# Patient Record
Sex: Female | Born: 1980 | Race: White | Hispanic: No | Marital: Married | Smoking: Never smoker
Health system: Southern US, Community
[De-identification: ages and names within clinical notes are randomized; demographics above are authoritative.]

## PROBLEM LIST (undated history)

## (undated) DIAGNOSIS — G809 Cerebral palsy, unspecified: Secondary | ICD-10-CM

## (undated) DIAGNOSIS — O1490 Unspecified pre-eclampsia, unspecified trimester: Secondary | ICD-10-CM

## (undated) HISTORY — PX: TUBAL LIGATION: SHX77

---

## 2007-08-06 ENCOUNTER — Encounter: Admission: RE | Admit: 2007-08-06 | Discharge: 2007-11-04 | Payer: Self-pay | Admitting: Counselor

## 2007-11-26 ENCOUNTER — Encounter: Admission: RE | Admit: 2007-11-26 | Discharge: 2008-02-24 | Payer: Self-pay | Admitting: Counselor

## 2009-10-14 ENCOUNTER — Ambulatory Visit (HOSPITAL_BASED_OUTPATIENT_CLINIC_OR_DEPARTMENT_OTHER): Admission: RE | Admit: 2009-10-14 | Discharge: 2009-10-14 | Payer: Self-pay | Admitting: Orthopaedic Surgery

## 2009-10-14 ENCOUNTER — Ambulatory Visit: Payer: Self-pay | Admitting: Diagnostic Radiology

## 2009-11-26 ENCOUNTER — Encounter: Admission: RE | Admit: 2009-11-26 | Discharge: 2009-12-21 | Payer: Self-pay | Admitting: Orthopaedic Surgery

## 2009-12-21 ENCOUNTER — Encounter: Admission: RE | Admit: 2009-12-21 | Discharge: 2009-12-22 | Payer: Self-pay | Admitting: Orthopaedic Surgery

## 2010-01-06 ENCOUNTER — Ambulatory Visit: Payer: Self-pay | Admitting: Diagnostic Radiology

## 2010-01-06 ENCOUNTER — Ambulatory Visit (HOSPITAL_BASED_OUTPATIENT_CLINIC_OR_DEPARTMENT_OTHER): Admission: RE | Admit: 2010-01-06 | Discharge: 2010-01-06 | Payer: Self-pay | Admitting: Orthopaedic Surgery

## 2013-09-07 ENCOUNTER — Emergency Department (HOSPITAL_BASED_OUTPATIENT_CLINIC_OR_DEPARTMENT_OTHER)
Admission: EM | Admit: 2013-09-07 | Discharge: 2013-09-07 | Disposition: A | Discharge status: Home / Self Care | Payer: 59 | Attending: Emergency Medicine | Admitting: Emergency Medicine

## 2013-09-07 ENCOUNTER — Encounter (HOSPITAL_BASED_OUTPATIENT_CLINIC_OR_DEPARTMENT_OTHER): Payer: Self-pay | Admitting: Emergency Medicine

## 2013-09-07 DIAGNOSIS — B9789 Other viral agents as the cause of diseases classified elsewhere: Secondary | ICD-10-CM | POA: Insufficient documentation

## 2013-09-07 DIAGNOSIS — Z79899 Other long term (current) drug therapy: Secondary | ICD-10-CM | POA: Insufficient documentation

## 2013-09-07 DIAGNOSIS — J3489 Other specified disorders of nose and nasal sinuses: Secondary | ICD-10-CM | POA: Insufficient documentation

## 2013-09-07 DIAGNOSIS — R059 Cough, unspecified: Secondary | ICD-10-CM | POA: Insufficient documentation

## 2013-09-07 DIAGNOSIS — IMO0002 Reserved for concepts with insufficient information to code with codable children: Secondary | ICD-10-CM | POA: Insufficient documentation

## 2013-09-07 DIAGNOSIS — R05 Cough: Secondary | ICD-10-CM | POA: Insufficient documentation

## 2013-09-07 DIAGNOSIS — B349 Viral infection, unspecified: Secondary | ICD-10-CM

## 2013-09-07 MED ORDER — GUAIFENESIN ER 600 MG PO TB12: 600.0000 mg | ORAL_TABLET | Freq: Two times a day (BID) | ORAL | Status: AC

## 2013-09-07 MED ORDER — FLUTICASONE PROPIONATE 50 MCG/ACT NA SUSP: 2.0000 | Freq: Every day | NASAL | Status: AC

## 2013-09-07 NOTE — ED Notes (Signed)
Pt reports upper resp. Symptoms x 1 1/2 weeks , no improvements, only medicine taken was robitussin dm which she took about 20 minutes ago

## 2013-09-07 NOTE — ED Provider Notes (Signed)
CSN: [id number]     Arrival date & time 09/07/13  0309 History   First MD Initiated Contact with Patient 09/07/13 [phone]     Chief Complaint  Patient presents with  . Nasal Congestion   (Consider location/radiation/quality/duration/timing/severity/associated sxs/prior Treatment) Patient is a 32 y.o. female presenting with URI. The history is provided by the patient.  URI Presenting symptoms: congestion, cough and rhinorrhea   Presenting symptoms: no fever   Congestion:    Location:  Nasal   Interferes with sleep: no     Interferes with eating/drinking: no   Cough:    Cough characteristics:  Non-productive   Severity:  Mild   Onset quality:  Gradual   Timing:  Intermittent   Progression:  Unchanged   Chronicity:  New Rhinorrhea:    Quality:  Green   Severity:  Mild   Timing:  Intermittent   Progression:  Unchanged Severity:  Moderate Onset quality:  Gradual Timing:  Intermittent Progression:  Unchanged Chronicity:  New Relieved by:  Nothing Worsened by:  Nothing tried Ineffective treatments:  None tried Associated symptoms: no neck pain and no swollen glands   Risk factors: sick contacts   Risk factors: not elderly     History reviewed. No pertinent past medical history. History reviewed. No pertinent past surgical history. History reviewed. No pertinent family history. History  Substance Use Topics  . Smoking status: Never Smoker   . Smokeless tobacco: Not on file  . Alcohol Use: Not on file   OB History   Grav Para Term Preterm Abortions TAB SAB Ect Mult Living                 Review of Systems  Constitutional: Negative for fever.  HENT: Positive for congestion and rhinorrhea.   Respiratory: Positive for cough.   Musculoskeletal: Negative for neck pain.  All other systems reviewed and are negative.    Allergies  Review of patient's allergies indicates no known allergies.  Home Medications   Current Outpatient Rx  Name  Route  Sig  Dispense  Refill   . fluticasone (FLONASE) 50 MCG/ACT nasal spray   Each Nare   Place 2 sprays into both nostrils daily.   16 g   0   . guaiFENesin (MUCINEX) 600 MG 12 hr tablet   Oral   Take 1 tablet (600 mg total) by mouth 2 (two) times daily.   14 tablet   0    BP 127/82  Pulse 102  Temp(Src) 99.2 F (37.3 C) (Oral)  Resp 18  Ht 5\' 6"  (1.676 m)  Wt 167 lb 9.6 oz (76.023 kg)  BMI 27.06 kg/m2  SpO2 97%  LMP 08/24/2013 Physical Exam  Constitutional: She is oriented to person, place, and time. She appears well-developed and well-nourished. No distress.  HENT:  Head: Normocephalic and atraumatic.  Mouth/Throat: Oropharynx is clear and moist.  Clear colorless drainage down the back of the throat  Eyes: Conjunctivae are normal. Pupils are equal, round, and reactive to light.  Neck: Normal range of motion. Neck supple.  Cardiovascular: Normal rate, regular rhythm and intact distal pulses.   Pulmonary/Chest: Effort normal. She has no wheezes. She has no rales.  Abdominal: Soft. Bowel sounds are normal. There is no tenderness. There is no rebound and no guarding.  Musculoskeletal: Normal range of motion.  Lymphadenopathy:    She has no cervical adenopathy.  Neurological: She is alert and oriented to person, place, and time.  Skin: Skin is warm and dry.  Psychiatric: She has a normal mood and affect.    ED Course  Procedures (including critical care time) Labs Review Labs Reviewed - No data to display Imaging Review No results found.  EKG Interpretation   None       MDM   1. Viral syndrome    Will treat with decongestants.  Follow up with your PMD    Sheryll Dymek K Byrd Terrero-Rasch, MD 09/07/13 [phone]

## 2014-12-02 ENCOUNTER — Encounter (HOSPITAL_BASED_OUTPATIENT_CLINIC_OR_DEPARTMENT_OTHER): Payer: Self-pay | Admitting: *Deleted

## 2014-12-02 ENCOUNTER — Emergency Department (HOSPITAL_BASED_OUTPATIENT_CLINIC_OR_DEPARTMENT_OTHER): Payer: Commercial Managed Care - PPO

## 2014-12-02 ENCOUNTER — Emergency Department (HOSPITAL_BASED_OUTPATIENT_CLINIC_OR_DEPARTMENT_OTHER)
Admission: EM | Admit: 2014-12-02 | Discharge: 2014-12-03 | Disposition: A | Discharge status: Home / Self Care | Payer: Commercial Managed Care - PPO | Attending: Emergency Medicine | Admitting: Emergency Medicine

## 2014-12-02 DIAGNOSIS — R2243 Localized swelling, mass and lump, lower limb, bilateral: Secondary | ICD-10-CM | POA: Diagnosis present

## 2014-12-02 DIAGNOSIS — Z79899 Other long term (current) drug therapy: Secondary | ICD-10-CM | POA: Insufficient documentation

## 2014-12-02 DIAGNOSIS — R609 Edema, unspecified: Secondary | ICD-10-CM | POA: Insufficient documentation

## 2014-12-02 DIAGNOSIS — Z3202 Encounter for pregnancy test, result negative: Secondary | ICD-10-CM | POA: Diagnosis not present

## 2014-12-02 DIAGNOSIS — Z7951 Long term (current) use of inhaled steroids: Secondary | ICD-10-CM | POA: Diagnosis not present

## 2014-12-02 DIAGNOSIS — R600 Localized edema: Secondary | ICD-10-CM

## 2014-12-02 DIAGNOSIS — Z8669 Personal history of other diseases of the nervous system and sense organs: Secondary | ICD-10-CM | POA: Insufficient documentation

## 2014-12-02 HISTORY — DX: Cerebral palsy, unspecified: G80.9

## 2014-12-02 HISTORY — DX: Unspecified pre-eclampsia, unspecified trimester: O14.90

## 2014-12-02 LAB — CBC WITH DIFFERENTIAL/PLATELET
BASOS ABS: 0 10*3/uL (ref 0.0–0.1)
BASOS PCT: 0 % (ref 0–1)
EOS ABS: 0.3 10*3/uL (ref 0.0–0.7)
Eosinophils Relative: 4 % (ref 0–5)
HCT: 38.2 % (ref 36.0–46.0)
HEMOGLOBIN: 12.4 g/dL (ref 12.0–15.0)
LYMPHS PCT: 32 % (ref 12–46)
Lymphs Abs: 2.8 10*3/uL (ref 0.7–4.0)
MCH: 27 pg (ref 26.0–34.0)
MCHC: 32.5 g/dL (ref 30.0–36.0)
MCV: 83.2 fL (ref 78.0–100.0)
MONOS PCT: 8 % (ref 3–12)
Monocytes Absolute: 0.7 10*3/uL (ref 0.1–1.0)
NEUTROS PCT: 56 % (ref 43–77)
Neutro Abs: 4.9 10*3/uL (ref 1.7–7.7)
Platelets: 301 10*3/uL (ref 150–400)
RBC: 4.59 MIL/uL (ref 3.87–5.11)
RDW: 13.3 % (ref 11.5–15.5)
WBC: 8.7 10*3/uL (ref 4.0–10.5)

## 2014-12-02 LAB — COMPREHENSIVE METABOLIC PANEL
ALBUMIN: 3.6 g/dL (ref 3.5–5.2)
ALK PHOS: 85 U/L (ref 39–117)
ALT: 25 U/L (ref 0–35)
ANION GAP: 6 (ref 5–15)
AST: 29 U/L (ref 0–37)
BILIRUBIN TOTAL: 0.2 mg/dL — AB (ref 0.3–1.2)
BUN: 15 mg/dL (ref 6–23)
CO2: 26 mmol/L (ref 19–32)
CREATININE: 0.85 mg/dL (ref 0.50–1.10)
Calcium: 8.8 mg/dL (ref 8.4–10.5)
Chloride: 108 mmol/L (ref 96–112)
GFR calc non Af Amer: 88 mL/min — ABNORMAL LOW (ref >90)
GLUCOSE: 105 mg/dL — AB (ref 70–99)
POTASSIUM: 3.6 mmol/L (ref 3.5–5.1)
SODIUM: 140 mmol/L (ref 135–145)
Total Protein: 6.7 g/dL (ref 6.0–8.3)

## 2014-12-02 LAB — URINALYSIS, ROUTINE W REFLEX MICROSCOPIC
Bilirubin Urine: NEGATIVE
GLUCOSE, UA: NEGATIVE mg/dL
Hgb urine dipstick: NEGATIVE
KETONES UR: NEGATIVE mg/dL
LEUKOCYTES UA: NEGATIVE
Nitrite: POSITIVE — AB
PROTEIN: NEGATIVE mg/dL
Specific Gravity, Urine: 1.018 (ref 1.005–1.030)
UROBILINOGEN UA: 0.2 mg/dL (ref 0.0–1.0)
pH: 6 (ref 5.0–8.0)

## 2014-12-02 LAB — URINE MICROSCOPIC-ADD ON

## 2014-12-02 NOTE — ED Provider Notes (Signed)
CSN: [id number]     Arrival date & time 12/02/14  2049 History  This chart was scribed for Joan Mo, MD by Elon Spanner, ED Scribe. This patient was seen in room MH08/MH08 and the patient's care was started at 10:06 PM.   Chief Complaint  Patient presents with  . Leg Swelling   HPI HPI Comments: Joan Sims is a 34 y.o. female who presents to the Emergency Department complaining of bilateral leg swelling onset 3 weeks ago with associated redness on the right foot and halfway up to the knee onset several days ago.  She has had redness in the past as well. Symptoms improve with elevation.  She reports the area is sensitive to touch.  Patient reports she was seen 2 weeks ago for this complaint and received a [place] negative for DVT's.  Patient denies fever, n/v, CP, SOB. She has a history of CP with prior right lower extremity surgery, states that normally her right leg is smaller than the left, however it is prudent to receive more swelling.  Past Medical History  Diagnosis Date  . Preeclampsia   . Cerebral palsy    Past Surgical History  Procedure Laterality Date  . Bladder repair w/ cesarean section    . Tubal ligation     No family history on file. History  Substance Use Topics  . Smoking status: Never Smoker   . Smokeless tobacco: Not on file  . Alcohol Use: Yes   OB History    No data available     Review of Systems  Constitutional: Negative for fever.  Respiratory: Negative for shortness of breath.   Cardiovascular: Negative for chest pain.  Gastrointestinal: Negative for nausea and vomiting.  All other systems reviewed and are negative.     Allergies  Review of patient's allergies indicates no known allergies.  Home Medications   Prior to Admission medications   Medication Sig Start Date End Date Taking? Authorizing Provider  clindamycin (CLEOCIN) 300 MG capsule Take 1 capsule (300 mg total) by mouth 4 (four) times daily. X 7 days 12/03/14   Joan Mo,  MD  fluticasone [facility]) 50 MCG/ACT nasal spray Place 2 sprays into both nostrils daily. 09/07/13   April Palumbo, MD  guaiFENesin (MUCINEX) 600 MG 12 hr tablet Take 1 tablet (600 mg total) by mouth 2 (two) times daily. 09/07/13   April Palumbo, MD   BP 123/83 mmHg  Pulse 80  Temp(Src) 98.3 F (36.8 C) (Oral)  Resp 16  Ht  (1.676 m)  Wt 218 lb (98.884 kg)  BMI 35.20 kg/m2  SpO2 98%  LMP 11/28/2014 Physical Exam  Constitutional: She is oriented to person, place, and time. She appears well-developed and well-nourished.  HENT:  Head: Normocephalic and atraumatic.  Right Ear: External ear normal.  Left Ear: External ear normal.  Eyes: Conjunctivae and EOM are normal. Pupils are equal, round, and reactive to light.  Neck: Normal range of motion. Neck supple.  Cardiovascular: Normal rate, regular rhythm, normal heart sounds and intact distal pulses.   Pulmonary/Chest: Effort normal and breath sounds normal.  Abdominal: Soft. Bowel sounds are normal. There is no tenderness.  Musculoskeletal: Normal range of motion.  Bilateral pitting edema with circumferential mild erythema of the right lower extremity just distal to the knee.  Pulses 1+ palpable DP bilaterally.  Confirmed with doppler by nursing prior to my assessment  Neurological: She is alert and oriented to person, place, and time.  Skin: Skin is warm and  dry.  Vitals reviewed.   ED Course  Procedures (including critical care time)  DIAGNOSTIC STUDIES: Oxygen Saturation is 100% on RA, normal by my interpretation.    COORDINATION OF CARE:  10:15 PM Discussed treatment plan with patient at bedside.  Patient acknowledges and agrees with plan.    Labs Review Labs Reviewed  COMPREHENSIVE METABOLIC PANEL - Abnormal; Notable for the following:    Glucose, Bld 105 (*)    Total Bilirubin 0.2 (*)    GFR calc non Af Amer 88 (*)    All other components within normal limits  URINALYSIS, ROUTINE W REFLEX MICROSCOPIC - Abnormal;  Notable for the following:    Nitrite POSITIVE (*)    All other components within normal limits  URINE MICROSCOPIC-ADD ON - Abnormal; Notable for the following:    Squamous Epithelial / LPF FEW (*)    Bacteria, UA MANY (*)    All other components within normal limits  CBC WITH DIFFERENTIAL/PLATELET  PREGNANCY, URINE    Imaging Review [place] Venous Img Lower Bilateral  12/02/2014   CLINICAL DATA:  Subacute onset of right lower extremity erythema, and chronic bilateral lower extremity swelling. Initial encounter.  EXAM: BILATERAL LOWER EXTREMITY VENOUS DOPPLER ULTRASOUND  TECHNIQUE: Gray-scale sonography with graded compression, as well as color Doppler and duplex ultrasound were performed to evaluate the lower extremity deep venous systems from the level of the common femoral vein and including the common femoral, femoral, profunda femoral, popliteal and calf veins including the posterior tibial, peroneal and gastrocnemius veins when visible. The superficial great saphenous vein was also interrogated. Spectral Doppler was utilized to evaluate flow at rest and with distal augmentation maneuvers in the common femoral, femoral and popliteal veins.  COMPARISON:  None.  FINDINGS: RIGHT LOWER EXTREMITY  Common Femoral Vein: No evidence of thrombus. Normal compressibility, respiratory phasicity and response to augmentation.  Saphenofemoral Junction: No evidence of thrombus. Normal compressibility and flow on color Doppler imaging.  Profunda Femoral Vein: No evidence of thrombus. Normal compressibility and flow on color Doppler imaging.  Femoral Vein: No evidence of thrombus. Normal compressibility, respiratory phasicity and response to augmentation.  Popliteal Vein: No evidence of thrombus. Normal compressibility, respiratory phasicity and response to augmentation.  Calf Veins: No evidence of thrombus. Normal compressibility and flow on color Doppler imaging. The right peroneal vein is not visualized.  Superficial  Great Saphenous Vein: No evidence of thrombus. Normal compressibility and flow on color Doppler imaging.  Venous Reflux:  None.  Other Findings: Mild diffuse soft tissue edema is noted about the ankle.  LEFT LOWER EXTREMITY  Common Femoral Vein: No evidence of thrombus. Normal compressibility, respiratory phasicity and response to augmentation.  Saphenofemoral Junction: No evidence of thrombus. Normal compressibility and flow on color Doppler imaging.  Profunda Femoral Vein: No evidence of thrombus. Normal compressibility and flow on color Doppler imaging.  Femoral Vein: No evidence of thrombus. Normal compressibility, respiratory phasicity and response to augmentation.  Popliteal Vein: No evidence of thrombus. Normal compressibility, respiratory phasicity and response to augmentation.  Calf Veins: No evidence of thrombus. Normal compressibility and flow on color Doppler imaging.  Superficial Great Saphenous Vein: No evidence of thrombus. Normal compressibility and flow on color Doppler imaging.  Venous Reflux:  None.  Other Findings: Mild soft tissue edema is noted about the left ankle.  IMPRESSION: 1. No evidence of deep venous thrombosis. 2. Mild soft tissue edema noted about the ankles, more prominent on the right.   Electronically Signed   By:  Roanna Raider M.D.   On: 12/02/2014 23:45     EKG Interpretation None      MDM   Final diagnoses:  Pedal edema    34 y.o. female with pertinent PMH of recent C section 2 months ago presents with 3 weeks of R le swelling as above.  No systemic symptoms of fever, dyspnea, or other complaint.  Physical exam and vitals on arrival as above.  Wu unremarkable, including bil DVT study.   Concern for possible early cellulitis, so pt placed on clinda.  To fu with PCP.     I have reviewed all laboratory and imaging studies if ordered as above  1. Pedal edema           Joan Mo, MD 12/03/14 [phone]

## 2014-12-02 NOTE — ED Notes (Signed)
Patient transported to Ultrasound 

## 2014-12-02 NOTE — ED Notes (Signed)
Pt c/o swelling in her feet x2 weeks woth right > left. She sts that she was checked for a blood clot in her right foot and the test was negative. Pt gave birth 2 months ago and did have pre-eclampsia.

## 2014-12-02 NOTE — ED Notes (Signed)
MD at bedside. 

## 2014-12-03 ENCOUNTER — Encounter (HOSPITAL_BASED_OUTPATIENT_CLINIC_OR_DEPARTMENT_OTHER): Payer: Self-pay | Admitting: Emergency Medicine

## 2014-12-03 LAB — PREGNANCY, URINE: PREG TEST UR: NEGATIVE

## 2014-12-03 MED ORDER — CLINDAMYCIN HCL 300 MG PO CAPS: 300.0000 mg | ORAL_CAPSULE | Freq: Four times a day (QID) | ORAL | Status: AC

## 2014-12-03 NOTE — Discharge Instructions (Signed)
Peripheral Edema °You have swelling in your legs (peripheral edema). This swelling is due to excess accumulation of salt and water in your body. Edema may be a sign of heart, kidney or liver disease, or a side effect of a medication. It may also be due to problems in the leg veins. Elevating your legs and using special support stockings may be very helpful, if the cause of the swelling is due to poor venous circulation. Avoid long periods of standing, whatever the cause. °Treatment of edema depends on identifying the cause. Chips, pretzels, pickles and other salty foods should be avoided. Restricting salt in your diet is almost always needed. Water pills (diuretics) are often used to remove the excess salt and water from your body via urine. These medicines prevent the kidney from reabsorbing sodium. This increases urine flow. °Diuretic treatment may also result in lowering of potassium levels in your body. Potassium supplements may be needed if you have to use diuretics daily. Daily weights can help you keep track of your progress in clearing your edema. You should call your caregiver for follow up care as recommended. °SEEK IMMEDIATE MEDICAL CARE IF:  °· You have increased swelling, pain, redness, or heat in your legs. °· You develop shortness of breath, especially when lying down. °· You develop chest or abdominal pain, weakness, or fainting. °· You have a fever. °Document Released: 10/06/2004 Document Revised: 11/21/2011 Document Reviewed: 09/16/2009 °[employer]® Patient Information ©2015 [employer]. This information is not intended to replace advice given to you by your health care provider. Make sure you discuss any questions you have with your health care provider. °Cellulitis °Cellulitis is an infection of the skin and the tissue beneath it. The infected area is usually red and tender. Cellulitis occurs most often in the arms and lower legs.  °CAUSES  °Cellulitis is caused by bacteria that enter the skin  through cracks or cuts in the skin. The most common types of bacteria that cause cellulitis are staphylococci and streptococci. °SIGNS AND SYMPTOMS  °· Redness and warmth. °· Swelling. °· Tenderness or pain. °· Fever. °DIAGNOSIS  °Your health care provider can usually determine what is wrong based on a physical exam. Blood tests may also be done. °TREATMENT  °Treatment usually involves taking an antibiotic medicine. °HOME CARE INSTRUCTIONS  °· Take your antibiotic medicine as directed by your health care provider. Finish the antibiotic even if you start to feel better. °· Keep the infected arm or leg elevated to reduce swelling. °· Apply a warm cloth to the affected area up to 4 times per day to relieve pain. °· Take medicines only as directed by your health care provider. °· Keep all follow-up visits as directed by your health care provider. °SEEK MEDICAL CARE IF:  °· You notice red streaks coming from the infected area. °· Your red area gets larger or turns dark in color. °· Your bone or joint underneath the infected area becomes painful after the skin has healed. °· Your infection returns in the same area or another area. °· You notice a swollen bump in the infected area. °· You develop new symptoms. °· You have a fever. °SEEK IMMEDIATE MEDICAL CARE IF:  °· You feel very sleepy. °· You develop vomiting or diarrhea. °· You have a general ill feeling (malaise) with muscle aches and pains. °MAKE SURE YOU:  °· Understand these instructions. °· Will watch your condition. °· Will get help right away if you are not doing well or get worse. °  Document Released: 06/08/2005 Document Revised: 01/13/2014 Document Reviewed: 11/14/2011 °ExitCare® Patient Information ©2015 [employer]. This information is not intended to replace advice given to you by your health care provider. Make sure you discuss any questions you have with your health care provider. ° °

## 2015-02-19 ENCOUNTER — Emergency Department (HOSPITAL_BASED_OUTPATIENT_CLINIC_OR_DEPARTMENT_OTHER)
Admission: EM | Admit: 2015-02-19 | Discharge: 2015-02-19 | Disposition: A | Discharge status: Home / Self Care | Payer: Commercial Managed Care - PPO | Attending: Emergency Medicine | Admitting: Emergency Medicine

## 2015-02-19 ENCOUNTER — Encounter (HOSPITAL_BASED_OUTPATIENT_CLINIC_OR_DEPARTMENT_OTHER): Payer: Self-pay | Admitting: *Deleted

## 2015-02-19 DIAGNOSIS — Z7951 Long term (current) use of inhaled steroids: Secondary | ICD-10-CM | POA: Insufficient documentation

## 2015-02-19 DIAGNOSIS — L03115 Cellulitis of right lower limb: Secondary | ICD-10-CM | POA: Insufficient documentation

## 2015-02-19 DIAGNOSIS — Z8669 Personal history of other diseases of the nervous system and sense organs: Secondary | ICD-10-CM | POA: Insufficient documentation

## 2015-02-19 DIAGNOSIS — Z792 Long term (current) use of antibiotics: Secondary | ICD-10-CM | POA: Insufficient documentation

## 2015-02-19 DIAGNOSIS — Z79899 Other long term (current) drug therapy: Secondary | ICD-10-CM | POA: Insufficient documentation

## 2015-02-19 DIAGNOSIS — M7989 Other specified soft tissue disorders: Secondary | ICD-10-CM | POA: Diagnosis present

## 2015-02-19 MED ORDER — DOXYCYCLINE HYCLATE 100 MG PO CAPS: 100.0000 mg | ORAL_CAPSULE | Freq: Two times a day (BID) | ORAL | Status: AC

## 2015-02-19 MED ORDER — DOXYCYCLINE HYCLATE 100 MG PO CAPS: 100.0000 mg | ORAL_CAPSULE | Freq: Two times a day (BID) | ORAL | Status: DC

## 2015-02-19 NOTE — ED Provider Notes (Signed)
CSN: [id number]     Arrival date & time 02/19/15  0732 History   First MD Initiated Contact with Patient 02/19/15 [phone]     Chief Complaint  Patient presents with  . Leg Swelling     (Consider location/radiation/quality/duration/timing/severity/associated sxs/prior Treatment) The history is provided by the patient.   34 year old female with the recurrent right leg swelling and redness and some blistering to the anterior part of the right shin. Associated with itching and burning. Similar problem back in March treated as a cellulitis after extensive workup for the leg swelling. Patient was treated clindamycin. Patient states that had no effect whatsoever. At that time patient did not have any of the blistering or vesicles. Patient denies any exposure to poison ivy or any possibility of a contact dermatitis. No problem with the left leg. Patient had long-term swelling of the right leg. Worse since the delivery of this year. Patient has a history of cerebral palsy and had operations on the right leg in the past.  Past Medical History  Diagnosis Date  . Preeclampsia   . Cerebral palsy    Past Surgical History  Procedure Laterality Date  . Bladder repair w/ cesarean section    . Tubal ligation     History reviewed. No pertinent family history. History  Substance Use Topics  . Smoking status: Never Smoker   . Smokeless tobacco: Not on file  . Alcohol Use: Yes   OB History    No data available     Review of Systems  Constitutional: Negative for fever.  HENT: Negative for congestion.   Eyes: Negative for redness.  Respiratory: Negative for shortness of breath.   Cardiovascular: Negative for chest pain.  Gastrointestinal: Negative for abdominal pain.  Genitourinary: Negative for dysuria.  Musculoskeletal: Negative for myalgias.  Skin: Positive for rash.  Neurological: Negative for headaches.  Hematological: Does not bruise/bleed easily.  Psychiatric/Behavioral: Negative for  confusion.      Allergies  Review of patient's allergies indicates no known allergies.  Home Medications   Prior to Admission medications   Medication Sig Start Date End Date Taking? Authorizing Provider  clindamycin (CLEOCIN) 300 MG capsule Take 1 capsule (300 mg total) by mouth 4 (four) times daily. X 7 days 12/03/14   Mirian Mo, MD  doxycycline (VIBRAMYCIN) 100 MG capsule Take 1 capsule (100 mg total) by mouth 2 (two) times daily. 02/19/15   Vanetta Mulders, MD  fluticasone (FLONASE) 50 MCG/ACT nasal spray Place 2 sprays into both nostrils daily. 09/07/13   April Palumbo, MD  guaiFENesin (MUCINEX) 600 MG 12 hr tablet Take 1 tablet (600 mg total) by mouth 2 (two) times daily. 09/07/13   April Palumbo, MD   BP 131/87 mmHg  Pulse 84  Temp(Src) 97.7 F (36.5 C) (Oral)  Resp 18  Ht  (1.676 m)  Wt 231 lb 14.4 oz (105.189 kg)  BMI 37.45 kg/m2  SpO2 100% Physical Exam  Constitutional: She is oriented to person, place, and time. She appears well-developed and well-nourished. No distress.  HENT:  Head: Normocephalic and atraumatic.  Mouth/Throat: Oropharynx is clear and moist.  Eyes: Conjunctivae and EOM are normal. Pupils are equal, round, and reactive to light.  Neck: Normal range of motion.  Cardiovascular: Normal rate, regular rhythm and normal heart sounds.   No murmur heard. Pulmonary/Chest: Effort normal and breath sounds normal. No respiratory distress.  Abdominal: Soft. Bowel sounds are normal. There is no tenderness.  Musculoskeletal: She exhibits edema.  Normal except for  right anterior shin with large area of erythema with some vesicles. Area of redness of and increased warmth is approximately 15 x 7 cm. Some increased swelling to the anterior part of the foot with some slight redness there. No streaking. Refill is 2 seconds sensations intact. All the vesicles are intact.  Neurological: She is alert and oriented to person, place, and time. No cranial nerve deficit.  She exhibits normal muscle tone. Coordination normal.  Skin: Skin is warm. Rash noted. There is erythema.  Nursing note and vitals reviewed.   ED Course  Procedures (including critical care time) Labs Review Labs Reviewed - No data to display  Imaging Review No results found.   EKG Interpretation None      MDM   Final diagnoses:  Cellulitis of right lower extremity    Patient with recurrent cellulitis to the right anterior shin. Had similar problem in March. Patient stated that was treated with clindamycin without any effect on it whatsoever. Clinically here there is some mild vesicles associated with it denies any poison ivy exposure. But becoming suspicious that this recurrent redness and area could be dermatological and not cellulitis. However will treat as cellulitis with doxycycline. Patient will start using hydrocortisone cream if no improvement with the anabiotic like before after a few days.  In March patient had extensive workup which included bilateral Dopplers with everything being negative. Patient has a history of cerebral palsy. Since her delivery earlier this year patient's had some increase problems with swelling to the right leg.    Vanetta Mulders, MD 02/19/15 [phone]

## 2015-02-19 NOTE — ED Notes (Signed)
RLE swelling with redness and with multiple raised areas, c/o severe itching as well

## 2015-02-19 NOTE — ED Notes (Signed)
Area marked with marker

## 2015-02-19 NOTE — Discharge Instructions (Signed)
As we discussed take the anabolic as directed. If you don't see improvement in a few days may want to consider treating the area with topical hydrocortisone cream. Also would recommend follow-up with primary care doctor and/or dermatology. Resource guide provided below.    Emergency Department Resource Guide 1) Find a Doctor and Pay Out of Pocket Although you won't have to find out who is covered by your insurance plan, it is a good idea to ask around and get recommendations. You will then need to call the office and see if the doctor you have chosen will accept you as a new patient and what types of options they offer for patients who are self-pay. Some doctors offer discounts or will set up payment plans for their patients who do not have insurance, but you will need to ask so you aren't surprised when you get to your appointment.  2) Contact Your Local Health Department Not all health departments have doctors that can see patients for sick visits, but many do, so it is worth a call to see if yours does. If you don't know where your local health department is, you can check in your phone book. The CDC also has a tool to help you locate your state's health department, and many state websites also have listings of all of their local health departments.  3) Find a Walk-in Clinic If your illness is not likely to be very severe or complicated, you may want to try a walk in clinic. These are popping up all over the country in pharmacies, drugstores, and shopping centers. They're usually staffed by nurse practitioners or physician assistants that have been trained to treat common illnesses and complaints. They're usually fairly quick and inexpensive. However, if you have serious medical issues or chronic medical problems, these are probably not your best option.  No Primary Care Doctor: - Call Health Connect at  [phone] - they can help you locate a primary care doctor that  accepts your insurance,  provides certain services, etc. - Physician Referral Service- [phone]  Chronic Pain Problems: Organization         Address  Phone   Notes  [facility]  [phone] Patients need to be referred by their primary care doctor.   Medication Assistance: Organization         Address  Phone   Notes  [facility] Medication [facility] [address] [place] [place], [place] [address] [phone] --Must be a resident of [facility] -- Must have NO insurance coverage whatsoever (no Medicaid/ Medicare, etc.) -- The pt. MUST have a primary care doctor that directs their care regularly and follows them in the community   [facility]  [phone]   [employer]  [phone]    Agencies that provide inexpensive medical care: Organization         Address  Phone   Notes  [facility]  [phone]   [facility]    [phone]   [facility] [address] [place], [place] [address] [phone]   [facility] [address] [place] [place] [phone]   Planned Parenthood    [phone]   [facility]    [phone]   [facility]  [address], [place] Phone:  [phone], Fax:  [phone] Hours of Operation:  9 am - 6 pm, M-F.  Also  accepts Medicaid/Medicare and self-pay.  [facility] [place] [address], [place] Phone: [phone], Fax: [phone]. Hours of Operation:  8:30 am - 5:30 pm, M-F.  Also accepts Medicaid and self-pay.  [facility] [place] [address], [place] Phone: [phone]   [place], [place], [place] [phone], Ext. [phone] Mondays & Thursdays: 7-9 AM.  First 15 patients are seen on a first come, first serve basis.    [place] Providers:  Organization         Address  Phone    Notes  [facility] [address], [place] [phone] Also accepts self-pay patients.  [facility] [id number] [place], [place]  [phone]   [place], [address], [place] [phone]   [facility] [address], [place] [phone]   Lucianne Lei [address], [place]   [phone] Only accepts [place] Access [place] patients after they have their name applied to their card.   Self-Pay (no insurance) in [facility]:  Organization         Address  Phone   Notes  Sickle Cell Patients, [facility] Internal Medicine [place] [phone]   [facility] [place] [phone]   [facility] [place]  [place], [place], [place] [phone]   [facility]/Dr. Osei-Bonsu  [address], [place] or [address], [place] [phone] Phone number for both [place] and [place] locations is the same.  [facility] [address] [place] [phone]   [facility] [address], [place] or [address] [phone]   [facility] [address] [place] [phone], phone; [phone], fax Sees patients 1st and 3rd Saturday of every month.  Must not qualify for public or private insurance (i.e. Medicaid, Medicare, [place] Health Choice, Veterans' Benefits)  Household income should be no more than 200% of the poverty level The clinic cannot treat you if you are pregnant or think you are pregnant  Sexually transmitted diseases are not treated at the clinic.    Dental Care: Organization         Address  Phone  Notes  [facility] Department of [facility] [place] [phone] Accepts children up to age 65 who are enrolled in [place] or [place]; pregnant women with a Medicaid card; and children who have applied for Medicaid or [place] Health Choice, but were declined, whose parents can pay a reduced fee at time of service.  [facility] Department of [facility]  [address], [place] [phone] Accepts children up to age 36 who are enrolled in [place] or [place]; pregnant women with a Medicaid card; and children who have applied for Medicaid or [place] Health Choice, but were declined, whose parents can pay a reduced fee at time of service.  [facility]  [place] [phone] Patients are seen by appointment only. Walk-ins are not accepted. [place] will see patients 61 years of age and older. Monday - Tuesday (8am-5pm)  Most Wednesdays (8:30-5pm) $30 per visit, cash only  [facility] Adult [employer] PROGRAM  [address], [facility] [phone] Patients are seen by appointment only. Walk-ins are not accepted. [place] will see patients 29 years of age and older. One Wednesday Evening (Monthly: Volunteer Based).  $30 per visit, cash only  [place]  [phone] for adults; Children under age 60, call Graduate Pediatric Dentistry at [phone]. Children aged 57-14, please call [phone] to request a pediatric application.  Dental services are provided in all areas of dental care including fillings, crowns and bridges, complete and partial dentures, implants, gum treatment, root canals, and extractions. Preventive care is also provided. Treatment is provided to both adults and children. Patients are selected via a lottery and there is often a waiting list.   [facility] [address], [place]  [phone] [email or web address]   [facility] [address] [place], [place] [phone], Ext.  123 Second and Fourth Thursday of each month, opens at 6:30 AM; Clinic ends at 9 AM.  Patients are seen on a first-come first-served basis, and a limited number are seen during each clinic.   [facility]  [address] Hillard Danker [place], [place] [phone]   Eligibility Requirements You must have lived in [place], [place], or [place] counties for at least the last three months.   You cannot be eligible for state or federal sponsored [employer], including [employer], [place], or [employer].   You generally cannot be eligible for healthcare insurance through your employer.    How to apply: Eligibility screenings are held every Tuesday and Wednesday afternoon from 1:00 pm until 4:00 pm. You do not need an appointment for the interview!  [facility] [address] [place], [place]   [place]  [place] Department  [place]  [phone]    Behavioral Health Resources in the Community: Intensive Outpatient Programs Organization         Address  Phone  Notes  [facility] [place]. [address], [place], [place] [phone]   [facility]patient [address] [place], [place]   ADS: [facility] [address], [place], [place]   [place] [address]  [place], [place] or [phone]   Substance Abuse Resources Organization         Address  Phone  Notes  Alcohol and Drug Services  [phone]   [place]  ([phone]   [facility] [place]   Chinita Pester  ([phone]   Residential & Outpatient Substance Abuse Program  [phone]   Psychological Services Organization         Address  Phone  Notes  [facility] [place]  [place]  [phone]   [place] [address] [place] or [phone]    Mobile Crisis Teams Organization         Address  Phone  Notes  [facility], Mobile Crisis Care Unit  [phone]   [facility]otherapeutic [facility]  [address] [place], [place]   [place] [address] [place] [phone]    Self-Help/Support Groups Organization         Address  Phone  Notes  [facility] [place] - variety of support groups  [place] Call for more information  Narcotics Anonymous (NA), [facility] [address], [employer] [place]  2 meetings at this location   Special educational needs teacher         Address  Phone  Notes  ASAP Residential Treatment [place],    [place]  [phone]   [facility]  [address], [place] [address], [place], [place]   [place], [place] [phone] Admissions: 8am-3pm M-F  Incentives Substance [place] [address]    [place], [place] [phone]   [facility] [address] [place], [place], [place]   [facility] [address],  [place], [place]   [facility] Programs - Intensive Outpatient [address] Kristeen [address], [place], [place]   [facility] ([place].,  [place], [place] [phone] or [phone]   [facility] (RTS) [address], [place], [place] Accepts Medicaid  [facility] [place] [address],  [place] [phone] Substance Abuse/Addiction Treatment   [facility] Organization         Address  Phone  Notes  [facility]  [phone]   Domenic Schwab, PhD [address] Arlis Porta [place], [place]   [phone] or [phone]   [place]  [place], [place] [phone]   [facility] [address], [place], [place] [phone] Insurance/Medicaid/sponsorship through [facility] and Families [address], [address] [place]                                    [place], [place] [phone] [place] [address] [place], [place] [phone]    Dr. Adele Schilder  [phone]   [facility] [place] Dept. 1) [address], [place] 2) [place] 3)  [place] [address], [place] [phone]  [phone]   [place] [phone] or [phone] (After Hours)

## 2015-02-19 NOTE — ED Notes (Signed)
MD at bedside. 

## 2018-03-28 ENCOUNTER — Encounter (HOSPITAL_BASED_OUTPATIENT_CLINIC_OR_DEPARTMENT_OTHER): Payer: Self-pay | Admitting: Radiology

## 2018-03-28 ENCOUNTER — Other Ambulatory Visit: Payer: Self-pay

## 2018-03-28 ENCOUNTER — Emergency Department (HOSPITAL_COMMUNITY): Payer: Commercial Managed Care - PPO

## 2018-03-28 ENCOUNTER — Emergency Department (HOSPITAL_BASED_OUTPATIENT_CLINIC_OR_DEPARTMENT_OTHER)
Admission: EM | Admit: 2018-03-28 | Discharge: 2018-03-29 | Disposition: A | Discharge status: Other Acute Inpatient Hospital | Payer: Commercial Managed Care - PPO | Attending: Emergency Medicine | Admitting: Emergency Medicine

## 2018-03-28 ENCOUNTER — Emergency Department (HOSPITAL_BASED_OUTPATIENT_CLINIC_OR_DEPARTMENT_OTHER): Payer: Commercial Managed Care - PPO

## 2018-03-28 DIAGNOSIS — N3000 Acute cystitis without hematuria: Secondary | ICD-10-CM | POA: Insufficient documentation

## 2018-03-28 DIAGNOSIS — R42 Dizziness and giddiness: Secondary | ICD-10-CM | POA: Insufficient documentation

## 2018-03-28 DIAGNOSIS — G809 Cerebral palsy, unspecified: Secondary | ICD-10-CM | POA: Insufficient documentation

## 2018-03-28 DIAGNOSIS — Z79899 Other long term (current) drug therapy: Secondary | ICD-10-CM | POA: Insufficient documentation

## 2018-03-28 LAB — URINALYSIS, ROUTINE W REFLEX MICROSCOPIC
BILIRUBIN URINE: NEGATIVE
Glucose, UA: NEGATIVE mg/dL
HGB URINE DIPSTICK: NEGATIVE
Ketones, ur: NEGATIVE mg/dL
Leukocytes, UA: NEGATIVE
Nitrite: POSITIVE — AB
PH: 7.5 (ref 5.0–8.0)
Protein, ur: NEGATIVE mg/dL
SPECIFIC GRAVITY, URINE: 1.01 (ref 1.005–1.030)

## 2018-03-28 LAB — RAPID URINE DRUG SCREEN, HOSP PERFORMED
AMPHETAMINES: NOT DETECTED
BENZODIAZEPINES: NOT DETECTED
Cocaine: NOT DETECTED
OPIATES: NOT DETECTED
Tetrahydrocannabinol: NOT DETECTED

## 2018-03-28 LAB — CBC
HCT: 41.2 % (ref 36.0–46.0)
HEMOGLOBIN: 13.6 g/dL (ref 12.0–15.0)
MCH: 26 pg (ref 26.0–34.0)
MCHC: 33 g/dL (ref 30.0–36.0)
MCV: 78.8 fL (ref 78.0–100.0)
Platelets: 254 10*3/uL (ref 150–400)
RBC: 5.23 MIL/uL — ABNORMAL HIGH (ref 3.87–5.11)
RDW: 14.6 % (ref 11.5–15.5)
WBC: 8.6 10*3/uL (ref 4.0–10.5)

## 2018-03-28 LAB — BASIC METABOLIC PANEL
Anion gap: 7 (ref 5–15)
BUN: 12 mg/dL (ref 6–20)
CO2: 25 mmol/L (ref 22–32)
Calcium: 9 mg/dL (ref 8.9–10.3)
Chloride: 108 mmol/L (ref 98–111)
Creatinine, Ser: 0.53 mg/dL (ref 0.44–1.00)
GFR calc Af Amer: >60 mL/min (ref >60)
GFR calc non Af Amer: >60 mL/min (ref >60)
GLUCOSE: 108 mg/dL — AB (ref 70–99)
POTASSIUM: 3.9 mmol/L (ref 3.5–5.1)
Sodium: 140 mmol/L (ref 135–145)

## 2018-03-28 LAB — URINALYSIS, MICROSCOPIC (REFLEX)

## 2018-03-28 LAB — CBG MONITORING, ED: Glucose-Capillary: 104 mg/dL — ABNORMAL HIGH (ref 70–99)

## 2018-03-28 LAB — ETHANOL

## 2018-03-28 LAB — PREGNANCY, URINE: Preg Test, Ur: NEGATIVE

## 2018-03-28 MED ORDER — IOPAMIDOL (ISOVUE-370) INJECTION 76%
100.0000 mL | Freq: Once | INTRAVENOUS | Status: AC | PRN
  Administered 2018-03-28: 100 mL via INTRAVENOUS

## 2018-03-28 MED ORDER — LORAZEPAM 2 MG/ML IJ SOLN
0.5000 mg | Freq: Once | INTRAMUSCULAR | Status: AC
  Administered 2018-03-28: 0.5 mg via INTRAVENOUS
  Filled 2018-03-28: qty 1

## 2018-03-28 MED ORDER — MECLIZINE HCL 25 MG PO TABS
50.0000 mg | ORAL_TABLET | Freq: Once | ORAL | Status: AC
  Administered 2018-03-28: 50 mg via ORAL
  Filled 2018-03-28: qty 2

## 2018-03-28 MED ORDER — LORAZEPAM 2 MG/ML IJ SOLN
1.0000 mg | Freq: Once | INTRAMUSCULAR | Status: AC
  Administered 2018-03-28: 1 mg via INTRAVENOUS
  Filled 2018-03-28: qty 1

## 2018-03-28 MED ORDER — FOSFOMYCIN TROMETHAMINE 3 G PO PACK
3.0000 g | PACK | Freq: Once | ORAL | Status: AC
  Administered 2018-03-28: 3 g via ORAL
  Filled 2018-03-28: qty 3

## 2018-03-28 MED ORDER — MECLIZINE HCL 25 MG PO TABS
25.0000 mg | ORAL_TABLET | Freq: Once | ORAL | Status: AC
  Administered 2018-03-28: 25 mg via ORAL
  Filled 2018-03-28: qty 1

## 2018-03-28 MED ORDER — DIAZEPAM 2 MG PO TABS
2.0000 mg | ORAL_TABLET | Freq: Once | ORAL | Status: AC
  Administered 2018-03-28: 2 mg via ORAL
  Filled 2018-03-28: qty 1

## 2018-03-28 MED ORDER — SODIUM CHLORIDE 0.9 % IV BOLUS
1000.0000 mL | Freq: Once | INTRAVENOUS | Status: AC
  Administered 2018-03-28: 1000 mL via INTRAVENOUS

## 2018-03-28 MED ORDER — DEXAMETHASONE SODIUM PHOSPHATE 10 MG/ML IJ SOLN
10.0000 mg | Freq: Once | INTRAMUSCULAR | Status: AC
  Administered 2018-03-28: 10 mg via INTRAVENOUS
  Filled 2018-03-28: qty 1

## 2018-03-28 NOTE — ED Triage Notes (Signed)
Pt arrived by  [facility]  Transferred from [facility] to get a mri  Dizziness with n and v was seen there x2 for the same  Pt moved to waiting room no rooms in back and no  Rooms available in triage.  Alert no distress

## 2018-03-28 NOTE — ED Provider Notes (Signed)
Assumed care of the patient.  [facility].  Briefly, the patient is a very well-appearing 37 year old female with history of cerebral palsy here with what I suspect is a peripheral vertigo.  She has unidirectional, horizontal nystagmus that is made worse with head position changes.  However, she had persistent, severe symptoms so was sent here for MRI.  Of note, the patient works in a daycare and I suspect she may have a viral labyrinthitis.  History is not entirely consistent with BPPV and she had no improvement with Dix-Hallpike.  Will give higher doses of meclizine and Ativan, sent for MRI.  Suspect she can be managed as an outpatient if her symptoms improve and the MRI is negative.   Shaune PollackIsaacs, Belinda Schlichting, MD 03/28/18 2213

## 2018-03-28 NOTE — ED Notes (Signed)
Patient transported to CT 

## 2018-03-28 NOTE — ED Triage Notes (Addendum)
Per EMS patient from home.  C/o dizziness and nausea since yesterday afternoon which has since become worse.  Reports tried taking meclizine and zofran but then vomited it back up last night.  Reports that this morning she was unable to stay balanced when walking.  Given 8mg  zofran IV via EMS

## 2018-03-28 NOTE — ED Provider Notes (Signed)
[facility] EMERGENCY DEPARTMENT Provider Note   CSN: [id number] Arrival date & time: 03/28/18  4098     History   Chief Complaint Chief Complaint  Patient presents with  . Dizziness    HPI Joan Sims is a 37 y.o. female who presents emergency department chief complaint of dizziness.  Patient states that the first onset of her symptoms started around noon yesterday.  She was sitting in her car when she had onset of spinning sensation that lasted only a few seconds and then self resolved.  Around 4:30 PM symptoms returned however they were mild.  She was able to pick her children up and go to [place].  She said at times she would have to stop and write herself because she would feel as if she was spinning and going to fall however she kept her eyes focused she was okay.  Later that evening her symptoms progressively worsened to the point where anytime she opens her eyes or moves she feels like she is on a "bad roller coaster."  She has some relief keeping her eyes closed but has had to pull episodes of vomiting and dry heaving.  She is never had anything like this before.  She denies any recent neck injuries, motor vehicle collisions, chiropractic adjustments, ear pain, recent viral infections, barotrauma.  She was given Zofran upon arrival and her symptoms have improved.  She denies any other neurologic symptoms. HPI  Past Medical History:  Diagnosis Date  . Cerebral palsy (HCC)   . Preeclampsia     There are no active problems to display for this patient.   Past Surgical History:  Procedure Laterality Date  . BLADDER REPAIR W/ CESAREAN SECTION    . TUBAL LIGATION       OB History   None      Home Medications    Prior to Admission medications   Medication Sig Start Date End Date Taking? Authorizing Provider  clindamycin (CLEOCIN) 300 MG capsule Take 1 capsule (300 mg total) by mouth 4 (four) times daily. X 7 days Patient not taking: Reported on 03/28/2018  12/03/14   Mirian Mo, MD  doxycycline (VIBRAMYCIN) 100 MG capsule Take 1 capsule (100 mg total) by mouth 2 (two) times daily. Patient not taking: Reported on 03/28/2018 02/19/15   Vanetta Mulders, MD  fluticasone [facility]) 50 MCG/ACT nasal spray Place 2 sprays into both nostrils daily. Patient not taking: Reported on 03/28/2018 09/07/13   Palumbo, April, MD  guaiFENesin (MUCINEX) 600 MG 12 hr tablet Take 1 tablet (600 mg total) by mouth 2 (two) times daily. Patient not taking: Reported on 03/28/2018 09/07/13   Cy Blamer, MD    Family History No family history on file.  Social History Social History   Tobacco Use  . Smoking status: Never Smoker  Substance Use Topics  . Alcohol use: Yes  . Drug use: Yes    Types: Marijuana     Allergies   Patient has no known allergies.   Review of Systems Review of Systems  Ten systems reviewed and are negative for acute change, except as noted in the HPI.   Physical Exam Updated Vital Signs BP 132/75   Pulse 95   Temp 98.5 F (36.9 C) (Oral)   Resp 13   Ht 5\' 6"  (1.676 m)   Wt 106.6 kg (235 lb)   LMP 02/28/2018   SpO2 100%   BMI 37.93 kg/m   Physical Exam  Constitutional: She is oriented to person,  place, and time. She appears well-developed and well-nourished. No distress.  Patient sitting with eyes closed.  Nystagmus is visible through closed lids  HENT:  Head: Normocephalic and atraumatic.  Eyes: Conjunctivae and EOM are normal. No scleral icterus.  Neck: Normal range of motion.  Cardiovascular: Normal rate, regular rhythm and normal heart sounds. Exam reveals no gallop and no friction rub.  No murmur heard. Pulmonary/Chest: Effort normal and breath sounds normal. No respiratory distress.  Abdominal: Soft. Bowel sounds are normal. She exhibits no distension and no mass. There is no tenderness. There is no guarding.  Neurological: She is alert and oriented to person, place, and time. No cranial nerve deficit.  Coordination normal.  Neurologic examination is limited secondary to patient's vertigo and nystagmus.  Patient has left beat only constant nystagmus.  No rotary component. Negative test of skew.  Otherwise normal upper and lower extremity strength, reflexes, sensation.   Gait and balance deffered  Skin: Skin is warm and dry. She is not diaphoretic.  Psychiatric: Her behavior is normal.  Nursing note and vitals reviewed.    ED Treatments / Results  Labs (all labs ordered are listed, but only abnormal results are displayed) Labs Reviewed  RAPID URINE DRUG SCREEN, HOSP PERFORMED - Abnormal; Notable for the following components:      Result Value   Barbiturates   (*)    Value: Result not available. Reagent lot number recalled by manufacturer.   All other components within normal limits  URINALYSIS, ROUTINE W REFLEX MICROSCOPIC - Abnormal; Notable for the following components:   APPearance CLOUDY (*)    Nitrite POSITIVE (*)    All other components within normal limits  BASIC METABOLIC PANEL - Abnormal; Notable for the following components:   Glucose, Bld 108 (*)    All other components within normal limits  CBC - Abnormal; Notable for the following components:   RBC 5.23 (*)    All other components within normal limits  URINALYSIS, MICROSCOPIC (REFLEX) - Abnormal; Notable for the following components:   Bacteria, UA MANY (*)    All other components within normal limits  CBG MONITORING, ED - Abnormal; Notable for the following components:   Glucose-Capillary 104 (*)    All other components within normal limits  URINE CULTURE  ETHANOL  PREGNANCY, URINE    EKG EKG Interpretation  Date/Time:  Wednesday March 28 2018 10:14:13 EDT Ventricular Rate:  68 PR Interval:    QRS Duration: 99 QT Interval:  416 QTC Calculation: 443 R Axis:   44 Text Interpretation:  Sinus rhythm Low voltage, extremity leads No old tracing to compare Confirmed by Azalia Bilis ([id number]) on 03/28/2018  11:47:07 AM   Radiology Ct Angio Head W Or Wo Contrast  Result Date: 03/28/2018 CLINICAL DATA:  Initial evaluation for acute dizziness, nausea. EXAM: CT ANGIOGRAPHY HEAD AND NECK TECHNIQUE: Multidetector CT imaging of the head and neck was performed using the standard protocol during bolus administration of intravenous contrast. Multiplanar CT image reconstructions and MIPs were obtained to evaluate the vascular anatomy. Carotid stenosis measurements (when applicable) are obtained utilizing NASCET criteria, using the distal internal carotid diameter as the denominator. CONTRAST:  ISOVUE-370 IOPAMIDOL (ISOVUE-370) INJECTION 76% COMPARISON:  None available. FINDINGS: CT HEAD FINDINGS Brain: Cerebral volume within normal limits for age. There is abnormal encephalomalacia involving the posterior periventricular white matter adjacent to the left lateral ventricle at the posterior left frontal region (series 5, image 20). Abnormal ex vacuo dilatation with slight angulation of the  underlying ventricular margin. Possible communicating cleft seen to the overlying brain surface within this region (series 8, image 40). White matter is thinned and atrophic in this area. Finding is chronic/congenital in appearance. No acute intracranial hemorrhage. No acute large vessel territory infarct. No mass lesion, midline shift or mass effect. Ex vacuo dilatation of the left lateral ventricle without hydrocephalus. Mild colpocephaly. Midline structures grossly intact. No extra-axial fluid collection. Vascular: No hyperdense vessel. Skull: Scalp soft tissues demonstrate no acute abnormality. Multiple oval circumscribed calcified nodules noted within the subcutaneous fat of the scalp, likely Trichilemmal cysts. Calvarium intact. Sinuses: Paranasal sinuses and mastoid air cells are clear. Orbits: Globes and orbital soft tissues within normal limits. CTA NECK FINDINGS Aortic arch: Visualized aortic arch of normal caliber with  normal branch pattern. No flow-limiting stenosis about the origin of the great vessels. Visualized subclavian arteries widely patent. Right carotid system: Right common and internal carotid arteries widely patent without stenosis, dissection, or occlusion. Left carotid system: Left common and internal carotid arteries widely patent without stenosis, dissection, or occlusion. Vertebral arteries: Both of the vertebral arteries arise from the subclavian arteries. Vertebral arteries widely patent within the neck without stenosis, dissection, or occlusion. Skeleton: No acute osseous abnormality. No worrisome lytic or blastic osseous lesions. Mild cervical spondylolysis noted at C5-6. Other neck: Unremarkable. Upper chest: Unremarkable. Review of the MIP images confirms the above findings CTA HEAD FINDINGS Anterior circulation: Internal carotid arteries widely patent to the termini without stenosis. A1 segments widely patent. 2 mm focal outpouching at the anterior communicating artery suspicious for small aneurysm (series 11, image 236). Anterior cerebral arteries widely patent to their distal aspects without stenosis. M1 segments widely patent bilaterally without stenosis. Left MCA bifurcation within normal limits. Approximate 3 mm outpouching at the right MCA bifurcation, suspicious for small aneurysm. Evaluation somewhat limited as there is prominent venous contamination within this region. Distal MCA branches well perfused and symmetric. Posterior circulation: Vertebral arteries widely patent to the vertebrobasilar junction without stenosis. Posterior inferior cerebral arteries patent bilaterally. Left vertebral artery slightly dominant. Basilar artery widely patent to its distal aspect without stenosis. Superior cerebellar and posterior cerebral arteries widely patent bilaterally. Venous sinuses: Patent. Anatomic variants: None significant. Delayed phase: No abnormal enhancement. Review of the MIP images confirms the  above findings IMPRESSION: CT HEAD IMPRESSION 1. No acute intracranial abnormality. 2. 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 Finding is chronic and/or congenital in appearance, and may be related to previous perinatal insult and/or migrational abnormality. Further evaluation with nonemergent brain MRI suggested for further evaluation. CTA HEAD AND NECK IMPRESSION 1. Negative CTA for large vessel occlusion. No high-grade or correctable stenosis or significant atherosclerotic change involving the major arterial vasculature of the head and neck. 2. 2-3 mm focal outpouchings at the anterior communicating artery and right MCA bifurcation, suspicious for small aneurysms. Electronically Signed   By: Rise Mu M.D.   On: 03/28/2018 15:05   Ct Angio Neck W And/or Wo Contrast  Result Date: 03/28/2018 CLINICAL DATA:  Initial evaluation for acute dizziness, nausea. EXAM: CT ANGIOGRAPHY HEAD AND NECK TECHNIQUE: Multidetector CT imaging of the head and neck was performed using the standard protocol during bolus administration of intravenous contrast. Multiplanar CT image reconstructions and MIPs were obtained to evaluate the vascular anatomy. Carotid stenosis measurements (when applicable) are obtained utilizing NASCET criteria, using the distal  internal carotid diameter as the denominator. CONTRAST:  ISOVUE-370 IOPAMIDOL (ISOVUE-370) INJECTION 76% COMPARISON:  None available. FINDINGS: CT HEAD FINDINGS Brain: Cerebral volume within normal limits for age. There is abnormal encephalomalacia involving the posterior periventricular white matter adjacent to the left lateral ventricle at the posterior left frontal region (series 5, image 20). Abnormal ex vacuo dilatation with slight angulation of the underlying ventricular margin.  Possible communicating cleft seen to the overlying brain surface within this region (series 8, image 40). White matter is thinned and atrophic in this area. Finding is chronic/congenital in appearance. No acute intracranial hemorrhage. No acute large vessel territory infarct. No mass lesion, midline shift or mass effect. Ex vacuo dilatation of the left lateral ventricle without hydrocephalus. Mild colpocephaly. Midline structures grossly intact. No extra-axial fluid collection. Vascular: No hyperdense vessel. Skull: Scalp soft tissues demonstrate no acute abnormality. Multiple oval circumscribed calcified nodules noted within the subcutaneous fat of the scalp, likely Trichilemmal cysts. Calvarium intact. Sinuses: Paranasal sinuses and mastoid air cells are clear. Orbits: Globes and orbital soft tissues within normal limits. CTA NECK FINDINGS Aortic arch: Visualized aortic arch of normal caliber with normal branch pattern. No flow-limiting stenosis about the origin of the great vessels. Visualized subclavian arteries widely patent. Right carotid system: Right common and internal carotid arteries widely patent without stenosis, dissection, or occlusion. Left carotid system: Left common and internal carotid arteries widely patent without stenosis, dissection, or occlusion. Vertebral arteries: Both of the vertebral arteries arise from the subclavian arteries. Vertebral arteries widely patent within the neck without stenosis, dissection, or occlusion. Skeleton: No acute osseous abnormality. No worrisome lytic or blastic osseous lesions. Mild cervical spondylolysis noted at C5-6. Other neck: Unremarkable. Upper chest: Unremarkable. Review of the MIP images confirms the above findings CTA HEAD FINDINGS Anterior circulation: Internal carotid arteries widely patent to the termini without stenosis. A1 segments widely patent. 2 mm focal outpouching at the anterior communicating artery suspicious for small aneurysm (series 11,  image 236). Anterior cerebral arteries widely patent to their distal aspects without stenosis. M1 segments widely patent bilaterally without stenosis. Left MCA bifurcation within normal limits. Approximate 3 mm outpouching at the right MCA bifurcation, suspicious for small aneurysm. Evaluation somewhat limited as there is prominent venous contamination within this region. Distal MCA branches well perfused and symmetric. Posterior circulation: Vertebral arteries widely patent to the vertebrobasilar junction without stenosis. Posterior inferior cerebral arteries patent bilaterally. Left vertebral artery slightly dominant. Basilar artery widely patent to its distal aspect without stenosis. Superior cerebellar and posterior cerebral arteries widely patent bilaterally. Venous sinuses: Patent. Anatomic variants: None significant. Delayed phase: No abnormal enhancement. Review of the MIP images confirms the above findings IMPRESSION: CT HEAD IMPRESSION 1. No acute intracranial abnormality. 2. 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 Finding is chronic and/or congenital in appearance, and may be related to previous perinatal insult and/or migrational abnormality. Further evaluation with nonemergent brain MRI suggested for further evaluation. CTA HEAD AND NECK IMPRESSION 1. Negative CTA for large vessel occlusion. No high-grade or correctable stenosis or significant atherosclerotic change involving the major arterial vasculature of the head and neck. 2. 2-3 mm focal outpouchings at the anterior communicating artery and right MCA bifurcation, suspicious for small aneurysms. Electronically Signed   By: Rise Mu M.D.   On: 03/28/2018 15:05    Procedures Procedures (including critical care time)  Medications Ordered in  ED Medications  meclizine (ANTIVERT) tablet 25 mg (25 mg Oral Given 03/28/18 1020)  diazepam (VALIUM) tablet 2 mg (2 mg Oral Given 03/28/18 1052)  sodium chloride 0.9 % bolus 1,000 mL (0 mLs Intravenous Stopped 03/28/18 1235)  LORazepam (ATIVAN) injection 0.5 mg (0.5 mg Intravenous Given 03/28/18 1235)  iopamidol (ISOVUE-370) 76 % injection 100 mL (100 mLs Intravenous Contrast Given 03/28/18 1331)  fosfomycin (MONUROL) packet 3 g (3 g Oral Given 03/28/18 1411)  LORazepam (ATIVAN) injection 1 mg (1 mg Intravenous Given 03/28/18 1900)  dexamethasone (DECADRON) injection 10 mg (10 mg Intravenous Given 03/28/18 1857)  meclizine (ANTIVERT) tablet 50 mg (50 mg Oral Given 03/28/18 1855)     Initial Impression / Assessment and Plan / ED Course  I have reviewed the triage vital signs and the nursing notes.  Pertinent labs & imaging results that were available during my care of the patient were reviewed by me and considered in my medical decision making (see chart for details).  Clinical Course as of Mar 28 1916  Wed Mar 28, 2018  1350 Nitrite(!): POSITIVE [AH]    Clinical Course User Index [AH] Arthor CaptainHarris, Custer Pimenta, PA-C    Patient with urinary tract infection.  The culture sent and patient treated in the emergency department with single dose of oral fosfomycin. Patient was treated with 25 mg of oral meclizine, Zofran, oral Valium and half milligram of IV Ativan along with fluid repletion which did ease her nausea and made her feel somewhat improved however the patient was unable to ambulate after hours of rest in the ER and could not take more than a single step without nearly falling to the floor.  Due to this the patient will need inpatient admission if she does not improve and likely MRI for further evaluation.  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 Her CT angios showed otherwise  widely patent vertebral arteries.  I spoken with Dr. Shaune Pollackameron Isaacs who has accepted the patient in [facility].  Final Clinical Impressions(s) / ED Diagnoses   Final diagnoses:  None    ED Discharge Orders    None       Arthor CaptainHarris, Imad Shostak, PA-C 03/28/18 1917    Azalia Bilisampos, Kevin, MD 03/29/18 1031

## 2018-03-28 NOTE — ED Notes (Signed)
Pt reports that she still has dizziness with quick movements of the head.

## 2018-03-28 NOTE — ED Provider Notes (Signed)
Assumed care from Dr. Penne LashIssacs at shift change.  See prior notes for full H&P.  Briefly, 37 y.o. F here with likely vertigo, no real improvement from treatments tried.  Ultimately sent here for MRI.  Hx of CP but ambulates unassisted at baseline.  Plan:   Awaiting MRI.  If negative and ambulating ok, can be discharge.  If still cannot ambulate, will need admission.  Results for orders placed or performed during the hospital encounter of 03/28/18  Ethanol  Result Value Ref Range   Alcohol, Ethyl (B) <10 <10 mg/dL  Urine rapid drug screen (hosp performed)  Result Value Ref Range   Opiates NONE DETECTED NONE DETECTED   Cocaine NONE DETECTED NONE DETECTED   Benzodiazepines NONE DETECTED NONE DETECTED   Amphetamines NONE DETECTED NONE DETECTED   Tetrahydrocannabinol NONE DETECTED NONE DETECTED   Barbiturates (A) NONE DETECTED    Result not available. Reagent lot number recalled by manufacturer.  Urinalysis, Routine w reflex microscopic  Result Value Ref Range   Color, Urine YELLOW YELLOW   APPearance CLOUDY (A) CLEAR   Specific Gravity, Urine 1.010 1.005 - 1.030   pH 7.5 5.0 - 8.0   Glucose, UA NEGATIVE NEGATIVE mg/dL   Hgb urine dipstick NEGATIVE NEGATIVE   Bilirubin Urine NEGATIVE NEGATIVE   Ketones, ur NEGATIVE NEGATIVE mg/dL   Protein, ur NEGATIVE NEGATIVE mg/dL   Nitrite POSITIVE (A) NEGATIVE   Leukocytes, UA NEGATIVE NEGATIVE  Pregnancy, urine  Result Value Ref Range   Preg Test, Ur NEGATIVE NEGATIVE  Basic metabolic panel  Result Value Ref Range   Sodium 140 135 - 145 mmol/L   Potassium 3.9 3.5 - 5.1 mmol/L   Chloride 108 98 - 111 mmol/L   CO2 25 22 - 32 mmol/L   Glucose, Bld 108 (H) 70 - 99 mg/dL   BUN 12 6 - 20 mg/dL   Creatinine, Ser 9.810.53 0.44 - 1.00 mg/dL   Calcium 9.0 8.9 - 19.110.3 mg/dL   GFR calc non Af Amer >60 >60 mL/min   GFR calc Af Amer >60 >60 mL/min   Anion gap 7 5 - 15  CBC  Result Value Ref Range   WBC 8.6 4.0 - 10.5 K/uL   RBC 5.23 (H) 3.87 - 5.11 MIL/uL    Hemoglobin 13.6 12.0 - 15.0 g/dL   HCT 47.841.2 29.536.0 - 62.146.0 %   MCV 78.8 78.0 - 100.0 fL   MCH 26.0 26.0 - 34.0 pg   MCHC 33.0 30.0 - 36.0 g/dL   RDW 30.814.6 65.711.5 - 84.615.5 %   Platelets 254 150 - 400 K/uL  Urinalysis, Microscopic (reflex)  Result Value Ref Range   RBC / HPF 0-5 0 - 5 RBC/hpf   WBC, UA 6-10 0 - 5 WBC/hpf   Bacteria, UA MANY (A) NONE SEEN   Squamous Epithelial / LPF 21-50 0 - 5   Mucus PRESENT   CBG monitoring, ED  Result Value Ref Range   Glucose-Capillary 104 (H) 70 - 99 mg/dL   Comment 1 Notify RN    Comment 2 Document in Chart    Ct Angio Head W Or Wo Contrast  Result Date: 03/28/2018 CLINICAL DATA:  Initial evaluation for acute dizziness, nausea. EXAM: CT ANGIOGRAPHY HEAD AND NECK TECHNIQUE: Multidetector CT imaging of the head and neck was performed using the standard protocol during bolus administration of intravenous contrast. Multiplanar CT image reconstructions and MIPs were obtained to evaluate the vascular anatomy. Carotid stenosis measurements (when applicable) are obtained utilizing NASCET  criteria, using the distal internal carotid diameter as the denominator. CONTRAST:  ISOVUE-370 IOPAMIDOL (ISOVUE-370) INJECTION 76% COMPARISON:  None available. FINDINGS: CT HEAD FINDINGS Brain: Cerebral volume within normal limits for age. There is abnormal encephalomalacia involving the posterior periventricular white matter adjacent to the left lateral ventricle at the posterior left frontal region (series 5, image 20). Abnormal ex vacuo dilatation with slight angulation of the underlying ventricular margin. Possible communicating cleft seen to the overlying brain surface within this region (series 8, image 40). White matter is thinned and atrophic in this area. Finding is chronic/congenital in appearance. No acute intracranial hemorrhage. No acute large vessel territory infarct. No mass lesion, midline shift or mass effect. Ex vacuo dilatation of the left lateral ventricle  without hydrocephalus. Mild colpocephaly. Midline structures grossly intact. No extra-axial fluid collection. Vascular: No hyperdense vessel. Skull: Scalp soft tissues demonstrate no acute abnormality. Multiple oval circumscribed calcified nodules noted within the subcutaneous fat of the scalp, likely Trichilemmal cysts. Calvarium intact. Sinuses: Paranasal sinuses and mastoid air cells are clear. Orbits: Globes and orbital soft tissues within normal limits. CTA NECK FINDINGS Aortic arch: Visualized aortic arch of normal caliber with normal branch pattern. No flow-limiting stenosis about the origin of the great vessels. Visualized subclavian arteries widely patent. Right carotid system: Right common and internal carotid arteries widely patent without stenosis, dissection, or occlusion. Left carotid system: Left common and internal carotid arteries widely patent without stenosis, dissection, or occlusion. Vertebral arteries: Both of the vertebral arteries arise from the subclavian arteries. Vertebral arteries widely patent within the neck without stenosis, dissection, or occlusion. Skeleton: No acute osseous abnormality. No worrisome lytic or blastic osseous lesions. Mild cervical spondylolysis noted at C5-6. Other neck: Unremarkable. Upper chest: Unremarkable. Review of the MIP images confirms the above findings CTA HEAD FINDINGS Anterior circulation: Internal carotid arteries widely patent to the termini without stenosis. A1 segments widely patent. 2 mm focal outpouching at the anterior communicating artery suspicious for small aneurysm (series 11, image 236). Anterior cerebral arteries widely patent to their distal aspects without stenosis. M1 segments widely patent bilaterally without stenosis. Left MCA bifurcation within normal limits. Approximate 3 mm outpouching at the right MCA bifurcation, suspicious for small aneurysm. Evaluation somewhat limited as there is prominent venous contamination within this  region. Distal MCA branches well perfused and symmetric. Posterior circulation: Vertebral arteries widely patent to the vertebrobasilar junction without stenosis. Posterior inferior cerebral arteries patent bilaterally. Left vertebral artery slightly dominant. Basilar artery widely patent to its distal aspect without stenosis. Superior cerebellar and posterior cerebral arteries widely patent bilaterally. Venous sinuses: Patent. Anatomic variants: None significant. Delayed phase: No abnormal enhancement. Review of the MIP images confirms the above findings IMPRESSION: CT HEAD IMPRESSION 1. No acute intracranial abnormality. 2. 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 Finding is chronic and/or congenital in appearance, and may be related to previous perinatal insult and/or migrational abnormality. Further evaluation with nonemergent brain MRI suggested for further evaluation. CTA HEAD AND NECK IMPRESSION 1. Negative CTA for large vessel occlusion. No high-grade or correctable stenosis or significant atherosclerotic change involving the major arterial vasculature of the head and neck. 2. 2-3 mm focal outpouchings at the anterior communicating artery and right MCA bifurcation, suspicious for small aneurysms. Electronically Signed   By: Rise Mu M.D.   On: 03/28/2018 15:05   Ct Angio Neck W  And/or Wo Contrast  Result Date: 03/28/2018 CLINICAL DATA:  Initial evaluation for acute dizziness, nausea. EXAM: CT ANGIOGRAPHY HEAD AND NECK TECHNIQUE: Multidetector CT imaging of the head and neck was performed using the standard protocol during bolus administration of intravenous contrast. Multiplanar CT image reconstructions and MIPs were obtained to evaluate the vascular anatomy. Carotid stenosis measurements (when applicable) are  obtained utilizing NASCET criteria, using the distal internal carotid diameter as the denominator. CONTRAST:  ISOVUE-370 IOPAMIDOL (ISOVUE-370) INJECTION 76% COMPARISON:  None available. FINDINGS: CT HEAD FINDINGS Brain: Cerebral volume within normal limits for age. There is abnormal encephalomalacia involving the posterior periventricular white matter adjacent to the left lateral ventricle at the posterior left frontal region (series 5, image 20). Abnormal ex vacuo dilatation with slight angulation of the underlying ventricular margin. Possible communicating cleft seen to the overlying brain surface within this region (series 8, image 40). White matter is thinned and atrophic in this area. Finding is chronic/congenital in appearance. No acute intracranial hemorrhage. No acute large vessel territory infarct. No mass lesion, midline shift or mass effect. Ex vacuo dilatation of the left lateral ventricle without hydrocephalus. Mild colpocephaly. Midline structures grossly intact. No extra-axial fluid collection. Vascular: No hyperdense vessel. Skull: Scalp soft tissues demonstrate no acute abnormality. Multiple oval circumscribed calcified nodules noted within the subcutaneous fat of the scalp, likely Trichilemmal cysts. Calvarium intact. Sinuses: Paranasal sinuses and mastoid air cells are clear. Orbits: Globes and orbital soft tissues within normal limits. CTA NECK FINDINGS Aortic arch: Visualized aortic arch of normal caliber with normal branch pattern. No flow-limiting stenosis about the origin of the great vessels. Visualized subclavian arteries widely patent. Right carotid system: Right common and internal carotid arteries widely patent without stenosis, dissection, or occlusion. Left carotid system: Left common and internal carotid arteries widely patent without stenosis, dissection, or occlusion. Vertebral arteries: Both of the vertebral arteries arise from the subclavian arteries. Vertebral arteries  widely patent within the neck without stenosis, dissection, or occlusion. Skeleton: No acute osseous abnormality. No worrisome lytic or blastic osseous lesions. Mild cervical spondylolysis noted at C5-6. Other neck: Unremarkable. Upper chest: Unremarkable. Review of the MIP images confirms the above findings CTA HEAD FINDINGS Anterior circulation: Internal carotid arteries widely patent to the termini without stenosis. A1 segments widely patent. 2 mm focal outpouching at the anterior communicating artery suspicious for small aneurysm (series 11, image 236). Anterior cerebral arteries widely patent to their distal aspects without stenosis. M1 segments widely patent bilaterally without stenosis. Left MCA bifurcation within normal limits. Approximate 3 mm outpouching at the right MCA bifurcation, suspicious for small aneurysm. Evaluation somewhat limited as there is prominent venous contamination within this region. Distal MCA branches well perfused and symmetric. Posterior circulation: Vertebral arteries widely patent to the vertebrobasilar junction without stenosis. Posterior inferior cerebral arteries patent bilaterally. Left vertebral artery slightly dominant. Basilar artery widely patent to its distal aspect without stenosis. Superior cerebellar and posterior cerebral arteries widely patent bilaterally. Venous sinuses: Patent. Anatomic variants: None significant. Delayed phase: No abnormal enhancement. Review of the MIP images confirms the above findings IMPRESSION: CT HEAD IMPRESSION 1. No acute intracranial abnormality. 2. 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 Finding is chronic and/or congenital in appearance, and may be related to previous perinatal insult and/or migrational abnormality. Further evaluation with nonemergent  brain MRI suggested for further evaluation. CTA HEAD AND  NECK IMPRESSION 1. Negative CTA for large vessel occlusion. No high-grade or correctable stenosis or significant atherosclerotic change involving the major arterial vasculature of the head and neck. 2. 2-3 mm focal outpouchings at the anterior communicating artery and right MCA bifurcation, suspicious for small aneurysms. Electronically Signed   By: Rise Mu M.D.   On: 03/28/2018 15:05   Mr Brain Wo Contrast  Result Date: 03/29/2018 CLINICAL DATA:  37 y/o  F; dizziness. EXAM: MRI HEAD WITHOUT CONTRAST TECHNIQUE: Multiplanar, multiecho pulse sequences of the brain and surrounding structures were obtained without intravenous contrast. COMPARISON:  03/28/2018 CT angiogram of the head. FINDINGS: Brain: There is gliosis and encephalomalacia centered within the left posterolateral frontal lobe. There is thinning of the posterior body of corpus callosum, volume loss within the parietal lobes, and enlargement of the posterior aspect of lateral ventricles/atria. There is faint T2 FLAIR hyperintense signal abnormality in periatrial white matter. 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 Several small gyri in the region of encephalomalacia are likely related to cortical volume loss, less likely focal polymicrogyria (series 5, image 19). No gray matter heterotopia or additional region of cortical abnormality is identified. No reduced diffusion to suggest acute or early subacute infarction. No abnormal susceptibility hypointensity to indicate intracranial hemorrhage. No focal mass effect of the brain, extra-axial collection, hydrocephalus, or herniation. Vascular: Normal flow voids. Skull and upper cervical spine: Normal marrow signal. Sinuses/Orbits: Negative. Other: Multiple dermal cysts in the scalp. IMPRESSION: 1. No acute intracranial abnormality identified. 2. Volume loss in the posterior cerebral  hemispheres, colpocephaly, and encephalomalacia within the left posterolateral frontal lobe. 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 3. Small gyri in the left posterolateral frontal lobe are likely related to volume loss, less likely focal polymicrogyria. Electronically Signed   By: Mitzi Hansen M.D.   On: 03/29/2018 00:23   12:42 AM MRI negative.  Patient was able to get up the bedside and take a few steps but very unsteady, nearly falling to her right.  Reports continued room spinning dizziness despite multiple medications. No longer vomiting, tolerating PO. Will consult for admission for continued symptomatic control.    1:06 AM Discussed with Dr. Antionette Char-- does not feel patient meets criteria for admission.  Can d/c home with assistive device or wheelchair as no acute intervention to offer her from inpatient standpoint.  Discussed with patient, I have written her for walker to use at home temporarily.  Husband will be taking tomorrow off to stay home with her, has 3 kids at home to help out as well.  Will have her follow-up with neurology for any ongoing issues.  She will return here for any new/acute changes.   Garlon Hatchet, PA-C 03/29/18 Margaretha Sheffield    Shaune Pollack, MD 03/29/18 1020

## 2018-03-28 NOTE — ED Notes (Signed)
Pt unable to ambulate. 

## 2018-03-29 MED ORDER — ONDANSETRON 4 MG PO TBDP: 4.0000 mg | ORAL_TABLET | Freq: Three times a day (TID) | ORAL | 0 refills | Status: AC | PRN

## 2018-03-29 MED ORDER — MECLIZINE HCL 25 MG PO TABS: 25.0000 mg | ORAL_TABLET | Freq: Three times a day (TID) | ORAL | 0 refills | Status: AC | PRN

## 2018-03-29 NOTE — Discharge Instructions (Signed)
Take the prescribed medication as directed.  Try to rest, stay hydrated. Follow-up with neurology if you continue having issues with persistent dizziness.  Also recommend to follow-up with your primary care doctor. Return to the ED for new or worsening symptoms-- new focal numbness, weakness, confusion, changes in speech, etc.

## 2018-03-30 LAB — URINE CULTURE: Culture: >=100000 — AB

## 2018-03-31 ENCOUNTER — Telehealth: Payer: Self-pay

## 2018-03-31 NOTE — Progress Notes (Signed)
ED Antimicrobial Stewardship Positive Culture Follow Up   Jennings BooksVictoria Hodgens is an 37 y.o. female who [facility] with a chief complaint of  Chief Complaint  Patient presents with  . Dizziness    Recent Results (from the past 720 hour(s))  Urine Culture     Status: Abnormal   Collection Time: 03/28/18 12:24 PM  Result Value Ref Range Status   Specimen Description   Final    URINE, RANDOM Performed at [facility] [place], [address] [place], [place] [address]    Special Requests   Final    NONE Performed at [facility] [place], [address] [place], [place] [address]    Culture >=100,000 COLONIES/mL ESCHERICHIA COLI (A)  Final   Report Status 03/30/2018 FINAL  Final   Organism ID, Bacteria ESCHERICHIA COLI (A)  Final      Susceptibility   Escherichia coli - MIC*    AMPICILLIN >=32 RESISTANT Resistant     CEFAZOLIN <=4 SENSITIVE Sensitive     CEFTRIAXONE <=1 SENSITIVE Sensitive     CIPROFLOXACIN <=0.25 SENSITIVE Sensitive     GENTAMICIN <=1 SENSITIVE Sensitive     IMIPENEM <=0.25 SENSITIVE Sensitive     NITROFURANTOIN <=16 SENSITIVE Sensitive     TRIMETH/SULFA <=20 SENSITIVE Sensitive     AMPICILLIN/SULBACTAM 16 INTERMEDIATE Intermediate     PIP/TAZO <=4 SENSITIVE Sensitive     Extended ESBL NEGATIVE Sensitive     * >=100,000 COLONIES/mL ESCHERICHIA COLI   No abx indicated  ED Provider: Shirlyn GoltzEmily Shrosbree, Langston MaskerPA-C  Jeremi Losito A Atiana Levier 03/31/2018, 9:06 AM Clinical Pharmacist Monday - Friday phone -  [phone] Saturday - Sunday phone - [phone]

## 2018-03-31 NOTE — Telephone Encounter (Signed)
No abx needed for UC from ED 03/29/18 per Shirlyn GoltzEmily Shrosbree PA

## 2018-07-21 ENCOUNTER — Emergency Department (HOSPITAL_BASED_OUTPATIENT_CLINIC_OR_DEPARTMENT_OTHER)
Admission: EM | Admit: 2018-07-21 | Discharge: 2018-07-21 | Disposition: A | Discharge status: Home / Self Care | Payer: Commercial Managed Care - PPO | Attending: Emergency Medicine | Admitting: Emergency Medicine

## 2018-07-21 ENCOUNTER — Encounter (HOSPITAL_BASED_OUTPATIENT_CLINIC_OR_DEPARTMENT_OTHER): Payer: Self-pay | Admitting: Emergency Medicine

## 2018-07-21 ENCOUNTER — Other Ambulatory Visit: Payer: Self-pay

## 2018-07-21 DIAGNOSIS — L723 Sebaceous cyst: Secondary | ICD-10-CM | POA: Diagnosis not present

## 2018-07-21 DIAGNOSIS — L089 Local infection of the skin and subcutaneous tissue, unspecified: Secondary | ICD-10-CM

## 2018-07-21 DIAGNOSIS — R220 Localized swelling, mass and lump, head: Secondary | ICD-10-CM | POA: Diagnosis present

## 2018-07-21 DIAGNOSIS — G809 Cerebral palsy, unspecified: Secondary | ICD-10-CM | POA: Insufficient documentation

## 2018-07-21 MED ORDER — LIDOCAINE-EPINEPHRINE (PF) 2 %-1:200000 IJ SOLN
10.0000 mL | Freq: Once | INTRAMUSCULAR | Status: DC
  Filled 2018-07-21: qty 10

## 2018-07-21 NOTE — Discharge Instructions (Signed)
Cyst was removed today, since this was infected I would like for this to heal from the inside out on its own, you may wash her hair in the shower with gentle shampoo, monitor the area for any signs of worsening infection such as redness, swelling, increasing pain or drainage.  If any of these occur return to the emergency department or follow-up with your primary care doctor.  You can follow-up with a dermatologist to have your other cysts removed at a later date, if these become infected like the one you had today you can return for reevaluation.

## 2018-07-21 NOTE — ED Notes (Signed)
ED Provider at bedside, for I&D

## 2018-07-21 NOTE — ED Provider Notes (Signed)
[facility] EMERGENCY DEPARTMENT Provider Note   CSN: [id number] Arrival date & time: 07/21/18  1115     History   Chief Complaint Chief Complaint  Patient presents with  . Recurrent Skin Infections    HPI Deseree Zemaitis is a 37 y.o. female.  Zarahi Fuerst is a 37 y.o. Female with a history cerebral palsy and preeclampsia, who presents for evaluation of cyst on the scalp. She reports history of multiple scalp cysts and this one is present right on the hairline on the right, at the hair part. She reports the cyst has been there for at least 6 months, but has recently gotten larger and over the past 3-4 days the cyst has become red and warm, and tender to the touch.  She has not had any drainage from the area.  Reports she has a few other cysts elsewhere on her scalp but these are not painful or tender to the touch.  No fevers or chills, no nausea or vomiting.  Never had any of the cyst removed or drained before.     Past Medical History:  Diagnosis Date  . Cerebral palsy (HCC)   . Preeclampsia     There are no active problems to display for this patient.   Past Surgical History:  Procedure Laterality Date  . BLADDER REPAIR W/ CESAREAN SECTION    . TUBAL LIGATION       OB History   None      Home Medications    Prior to Admission medications   Medication Sig Start Date End Date Taking? Authorizing Provider  clindamycin (CLEOCIN) 300 MG capsule Take 1 capsule (300 mg total) by mouth 4 (four) times daily. X 7 days Patient not taking: Reported on 03/28/2018 12/03/14   Mirian Mo, MD  doxycycline (VIBRAMYCIN) 100 MG capsule Take 1 capsule (100 mg total) by mouth 2 (two) times daily. Patient not taking: Reported on 03/28/2018 02/19/15   Vanetta Mulders, MD  fluticasone [facility]) 50 MCG/ACT nasal spray Place 2 sprays into both nostrils daily. Patient not taking: Reported on 03/28/2018 09/07/13   Palumbo, April, MD  guaiFENesin (MUCINEX) 600 MG 12 hr tablet  Take 1 tablet (600 mg total) by mouth 2 (two) times daily. Patient not taking: Reported on 03/28/2018 09/07/13   Palumbo, April, MD  meclizine (ANTIVERT) 25 MG tablet Take 1 tablet (25 mg total) by mouth 3 (three) times daily as needed for dizziness. 03/29/18   Garlon Hatchet, PA-C  ondansetron (ZOFRAN ODT) 4 MG disintegrating tablet Take 1 tablet (4 mg total) by mouth every 8 (eight) hours as needed for nausea. 03/29/18   Garlon Hatchet, PA-C    Family History No family history on file.  Social History Social History   Tobacco Use  . Smoking status: Never Smoker  . Smokeless tobacco: Never Used  Substance Use Topics  . Alcohol use: Yes  . Drug use: Not Currently    Types: Marijuana     Allergies   Patient has no known allergies.   Review of Systems Review of Systems  Constitutional: Negative for chills and fever.  Skin: Positive for color change.       Cyst  Neurological: Negative for headaches.     Physical Exam Updated Vital Signs BP (!) 106/91 (BP Location: Right Arm)   Pulse 84   Temp 98.2 F (36.8 C) (Oral)   Resp 18   Ht 5\' 6"  (1.676 m)   Wt 98.9 kg   LMP 07/07/2018  SpO2 99%   BMI 35.19 kg/m   Physical Exam  Constitutional: She appears well-developed and well-nourished. No distress.  HENT:  Head: Normocephalic and atraumatic.  Scalp with multiple 1 to 2 cm sebaceous cyst present there is a cyst present at the hairline on the right side of the forehead that is erythematous, warm and tender to the touch, no expressible drainage, no other cysts appear erythematous or warm to suggest infection.  Eyes: Right eye exhibits no discharge. Left eye exhibits no discharge.  Pulmonary/Chest: Effort normal. No respiratory distress.  Neurological: She is alert. Coordination normal.  Skin: Skin is warm and dry. Capillary refill takes less than 2 seconds. She is not diaphoretic.  Psychiatric: She has a normal mood and affect. Her behavior is normal.  Nursing note and  vitals reviewed.    ED Treatments / Results  Labs (all labs ordered are listed, but only abnormal results are displayed) Labs Reviewed - No data to display  EKG None  Radiology No results found.  Procedures .Marland KitchenIncision and Drainage Date/Time: 07/21/2018 1:41 PM Performed by: Dartha Lodge, PA-C Authorized by: Dartha Lodge, PA-C   Location:    Type:  Cyst (Infected sebaceous cyst (scalp))   Location:  Head   Head location:  Scalp Pre-procedure details:    Skin preparation:  Chloraprep Anesthesia (see MAR for exact dosages):    Anesthesia method:  Local infiltration   Local anesthetic:  Lidocaine 2% WITH epi Procedure type:    Complexity:  Complex Procedure details:    Needle aspiration: yes     Needle size:  20 G   Incision types:  Single straight   Incision depth:  Dermal   Scalpel blade:  11   Wound management:  Probed and deloculated (Cyst sac was removed)   Drainage:  Purulent and serous   Drainage amount:  Moderate   Wound treatment:  Wound left open   Packing materials:  None Post-procedure details:    Patient tolerance of procedure:  Tolerated well, no immediate complications Comments:     Patient with sebaceous cyst that had become erythematous, painful and infected, the area was drained with a simple straight incision with a pocket filled with purulent drainage, there was a second pocket below with serous fluid, the cyst sac was easily removed in its entirety, given that the area was infected, the small incision was left open to heal on its own   (including critical care time)  Medications Ordered in ED Medications  lidocaine-EPINEPHrine (XYLOCAINE W/EPI) 2 %-1:200000 (PF) injection 10 mL (has no administration in time range)     Initial Impression / Assessment and Plan / ED Course  I have reviewed the triage vital signs and the nursing notes.  Pertinent labs & imaging results that were available during my care of the patient were reviewed by me and  considered in my medical decision making (see chart for details).  Pt  presents with infected sebaceous cyst.  Cyst is been present for the past 6 months she has history of similar on her scalp, but over the past 3 days it has become painful red and warm to the touch.  She has not noted any drainage at home.  Will plan for incision and drainage and cyst removal.  Infected cyst was incised and drained purulent fluid there was a second pocket of serous fluid present, the entire cyst sac was removed, because the area was infected it was left open to heal on its own.  I have discussed appropriate return precautions and wound care with the patient.  She is expressed understanding and is in agreement with plan we will have her follow-up with PCP or dermatology for evaluation of other cyst as they do not appear infected today.  Return precautions discussed.  Patient expresses understanding and is agreement with plan.  Final Clinical Impressions(s) / ED Diagnoses   Final diagnoses:  Infected sebaceous cyst    ED Discharge Orders    None       Dartha Lodge, [place] 07/21/18 1415    Linwood Dibbles, MD 07/21/18 1559

## 2018-07-21 NOTE — ED Triage Notes (Signed)
Pt here for boil to top of her head. Pt reports that she has others on her head from years ago but this area is painful to touch.

## 2019-11-09 IMAGING — CT CT ANGIO NECK
2 of 14 series · 5 of 34 positions shown · IV contrast (APPLIED)
Comparison: None available.

CLINICAL DATA: Initial evaluation for acute dizziness, nausea.

EXAM:
CT ANGIOGRAPHY HEAD AND NECK
TECHNIQUE: Multidetector CT imaging of the head and neck was performed using
the standard protocol during bolus administration of intravenous
contrast. Multiplanar CT image reconstructions and MIPs were
obtained to evaluate the vascular anatomy. Carotid stenosis
measurements (when applicable) are obtained utilizing NASCET
criteria, using the distal internal carotid diameter as the
denominator.
CONTRAST:  100mL [id number] IOPAMIDOL ([id number]) INJECTION 76%

[Series 8: sag soft · sagittal · 0.35mm/px · 1 of 54 slices shown]
[im 5/54  soft-tissue]
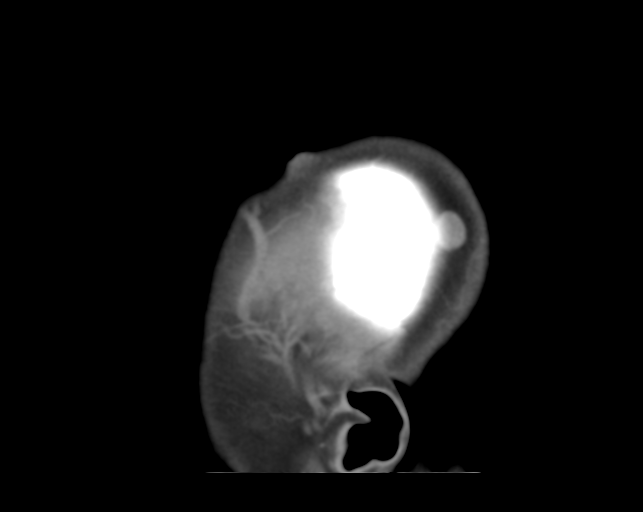

[Series 11: axial thin · axial · 0.43mm/px · z∈[-331,-122]mm · 4 of 349 slices shown]
[im 70/349  soft-tissue]
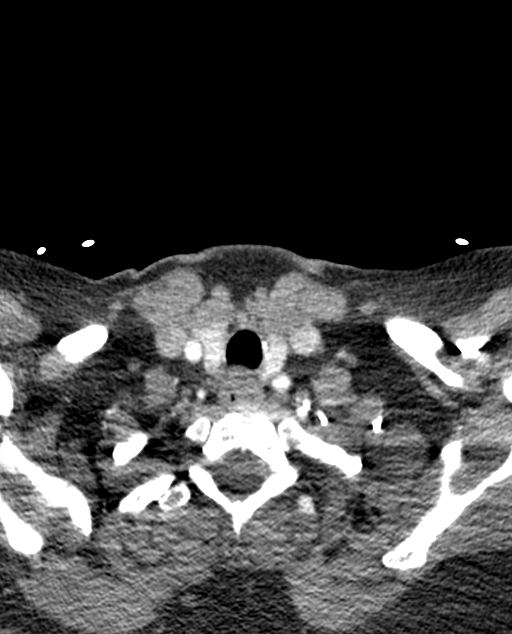
[im 140/349  bone]
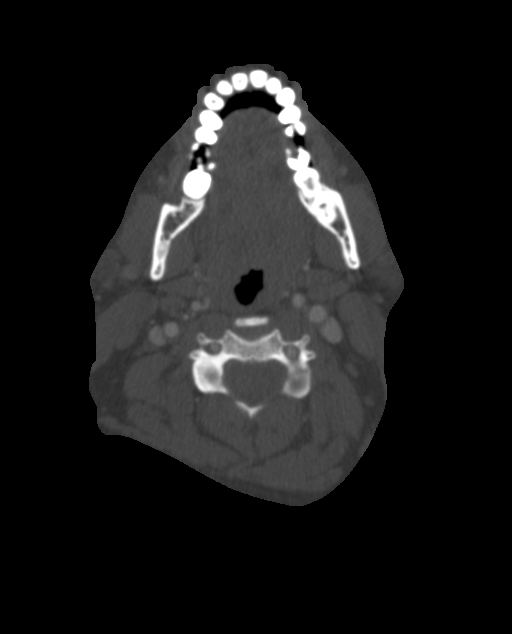
[im 209/349  soft-tissue]
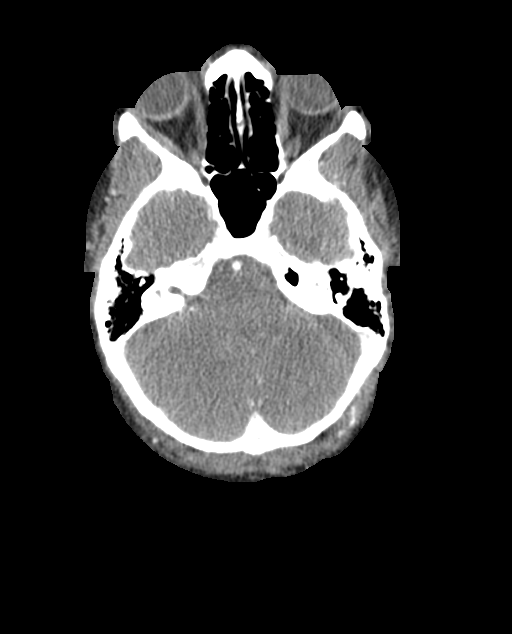
[im 279/349  bone]
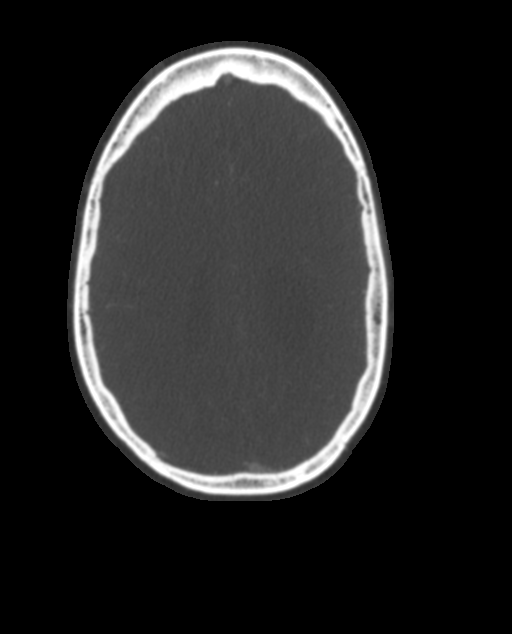

[5 of 34 positions shown; findings below may reference images not displayed]

FINDINGS: CT HEAD FINDINGS

Brain: Cerebral volume within normal limits for age. There is
abnormal encephalomalacia involving the posterior periventricular
white matter adjacent to the left lateral ventricle at the posterior
left frontal region (series 5, image 20). Abnormal ex vacuo
dilatation with slight angulation of the underlying ventricular
margin. Possible communicating cleft seen to the overlying brain
surface within this region (series 8, image 40). White matter is
thinned and atrophic in this area. Finding is chronic/congenital in
appearance.

No acute intracranial hemorrhage. No acute large vessel territory
infarct. No mass lesion, midline shift or mass effect. Ex vacuo
dilatation of the left lateral ventricle without hydrocephalus. Mild
colpocephaly. Midline structures grossly intact. No extra-axial
fluid collection.

Vascular: No hyperdense vessel.

Skull: Scalp soft tissues demonstrate no acute abnormality. Multiple
oval circumscribed calcified nodules noted within the subcutaneous
fat of the scalp, likely Trichilemmal cysts. Calvarium intact.

Sinuses: Paranasal sinuses and mastoid air cells are clear.

Orbits: Globes and orbital soft tissues within normal limits.

CTA NECK FINDINGS

Aortic arch: Visualized aortic arch of normal caliber with normal
branch pattern. No flow-limiting stenosis about the origin of the
great vessels. Visualized subclavian arteries widely patent.

Right carotid system: Right common and internal carotid arteries
widely patent without stenosis, dissection, or occlusion.

Left carotid system: Left common and internal carotid arteries
widely patent without stenosis, dissection, or occlusion.

Vertebral arteries: Both of the vertebral arteries arise from the
subclavian arteries. Vertebral arteries widely patent within the
neck without stenosis, dissection, or occlusion.

Skeleton: No acute osseous abnormality. No worrisome lytic or
blastic osseous lesions. Mild cervical spondylolysis noted at C5-6.

Other neck: Unremarkable.

Upper chest: Unremarkable.

Review of the MIP images confirms the above findings

CTA HEAD FINDINGS

Anterior circulation: Internal carotid arteries widely patent to the
termini without stenosis. A1 segments widely patent. 2 mm focal
outpouching at the anterior communicating artery suspicious for
small aneurysm (series 11, image 236). Anterior cerebral arteries
widely patent to their distal aspects without stenosis. M1 segments
widely patent bilaterally without stenosis. Left MCA bifurcation
within normal limits. Approximate 3 mm outpouching at the right MCA
bifurcation, suspicious for small aneurysm. Evaluation somewhat
limited as there is prominent venous contamination within this
region. Distal MCA branches well perfused and symmetric.

Posterior circulation: Vertebral arteries widely patent to the
vertebrobasilar junction without stenosis. Posterior inferior
cerebral arteries patent bilaterally. Left vertebral artery slightly
dominant. Basilar artery widely patent to its distal aspect without
stenosis. Superior cerebellar and posterior cerebral arteries widely
patent bilaterally.

Venous sinuses: Patent.

Anatomic variants: None significant.

Delayed phase: No abnormal enhancement.

Review of the MIP images confirms the above findings
IMPRESSION: CT HEAD IMPRESSION

1. No acute intracranial abnormality.
2. Chronic encephalomalacia with overlying white matter atrophy
involving the periventricular white matter at the posterior aspect
of the left lateral ventricle. Possible superimposed schizencephaly
within this region as above. Finding is chronic and/or congenital in
appearance, and may be related to previous perinatal insult and/or
migrational abnormality. Further evaluation with nonemergent brain
MRI suggested for further evaluation.

CTA HEAD AND NECK IMPRESSION

1. Negative CTA for large vessel occlusion. No high-grade or
correctable stenosis or significant atherosclerotic change involving
the major arterial vasculature of the head and neck.
2. 2-3 mm focal outpouchings at the anterior communicating artery
and right MCA bifurcation, suspicious for small aneurysms.

## 2020-09-04 ENCOUNTER — Other Ambulatory Visit: Payer: Self-pay

## 2020-09-04 ENCOUNTER — Encounter (HOSPITAL_BASED_OUTPATIENT_CLINIC_OR_DEPARTMENT_OTHER): Payer: Self-pay

## 2020-09-04 ENCOUNTER — Emergency Department (HOSPITAL_BASED_OUTPATIENT_CLINIC_OR_DEPARTMENT_OTHER)
Admission: EM | Admit: 2020-09-04 | Discharge: 2020-09-04 | Disposition: A | Discharge status: Home / Self Care | Payer: Commercial Managed Care - PPO | Attending: Emergency Medicine | Admitting: Emergency Medicine

## 2020-09-04 DIAGNOSIS — U071 COVID-19: Secondary | ICD-10-CM | POA: Diagnosis not present

## 2020-09-04 DIAGNOSIS — R509 Fever, unspecified: Secondary | ICD-10-CM | POA: Diagnosis present

## 2020-09-04 LAB — RESP PANEL BY RT-PCR (FLU A&B, COVID) ARPGX2
Influenza A by PCR: NEGATIVE
Influenza B by PCR: NEGATIVE
SARS Coronavirus 2 by RT PCR: POSITIVE — AB

## 2020-09-04 NOTE — ED Notes (Signed)
Discharge instructions discussed with patient. Verbalized understanding. Departs ED at this time in stable condition with strict return instructions.

## 2020-09-04 NOTE — ED Triage Notes (Signed)
Pt arrives with c/o fever starting last week reports she took at home covid test which was negative reports her fever came back yesterday, took at home Covid test which came back positive. C/O some body aches.

## 2020-09-04 NOTE — ED Notes (Signed)
Date and time results received: 09/04/20 7:45 PM  (use smartphrase ".now" to insert current time)  Test: COVID Critical Value: Positive  Name of Provider Notified: Dr. Criss Alvine  Orders Received? Or Actions Taken?: No new orders

## 2020-09-04 NOTE — ED Provider Notes (Signed)
[facility] EMERGENCY DEPARTMENT Provider Note   CSN: [id number] Arrival date & time: 09/04/20  1836     History Chief Complaint  Patient presents with  . Fever    Joan Sims is a 39 y.o. female.  HPI 39 year old female presents with concern for Covid-19.  She works in Audiological [employer] and had a fever last week but had a negative Covid test.  Symptoms went away a week ago but then now she is having return fever over the last day or so.  Took a home test that was positive for Covid-19.  Some cough and a little bit of tightness in the chest.  Has not been vaccinated.   Past Medical History:  Diagnosis Date  . Cerebral palsy (HCC)   . Preeclampsia     There are no problems to display for this patient.   Past Surgical History:  Procedure Laterality Date  . BLADDER REPAIR W/ CESAREAN SECTION    . TUBAL LIGATION       OB History   No obstetric history on file.     No family history on file.  Social History   Tobacco Use  . Smoking status: Never Smoker  . Smokeless tobacco: Never Used  Vaping Use  . Vaping Use: Never used  Substance Use Topics  . Alcohol use: Yes  . Drug use: Not Currently    Types: Marijuana    Home Medications Prior to Admission medications   Medication Sig Start Date End Date Taking? Authorizing Provider  clindamycin (CLEOCIN) 300 MG capsule Take 1 capsule (300 mg total) by mouth 4 (four) times daily. X 7 days Patient not taking: Reported on 03/28/2018 12/03/14   Mirian Mo, MD  doxycycline (VIBRAMYCIN) 100 MG capsule Take 1 capsule (100 mg total) by mouth 2 (two) times daily. Patient not taking: Reported on 03/28/2018 02/19/15   Vanetta Mulders, MD  fluticasone [facility]) 50 MCG/ACT nasal spray Place 2 sprays into both nostrils daily. Patient not taking: Reported on 03/28/2018 09/07/13   Palumbo, April, MD  guaiFENesin (MUCINEX) 600 MG 12 hr tablet Take 1 tablet (600 mg total) by mouth 2 (two) times daily. Patient not taking:  Reported on 03/28/2018 09/07/13   Palumbo, April, MD  meclizine (ANTIVERT) 25 MG tablet Take 1 tablet (25 mg total) by mouth 3 (three) times daily as needed for dizziness. 03/29/18   Garlon Hatchet, PA-C  ondansetron (ZOFRAN ODT) 4 MG disintegrating tablet Take 1 tablet (4 mg total) by mouth every 8 (eight) hours as needed for nausea. 03/29/18   Garlon Hatchet, PA-C    Allergies    Patient has no known allergies.  Review of Systems   Review of Systems  Constitutional: Positive for fever.  Respiratory: Positive for cough. Negative for shortness of breath.   Musculoskeletal: Positive for myalgias.    Physical Exam Updated Vital Signs BP (!) 160/108 (BP Location: Left Arm)   Pulse 88   Temp 98.5 F (36.9 C) (Oral)   Resp 18   Ht 5\' 6"  (1.676 m)   LMP 08/21/2020   SpO2 96%   BMI 35.19 kg/m   Physical Exam Vitals and nursing note reviewed.  Constitutional:      General: She is not in acute distress.    Appearance: She is well-developed and well-nourished. She is not ill-appearing or diaphoretic.  HENT:     Head: Normocephalic and atraumatic.     Right Ear: External ear normal.     Left Ear: External ear  normal.     Nose: Nose normal.  Eyes:     General:        Right eye: No discharge.        Left eye: No discharge.  Pulmonary:     Effort: Pulmonary effort is normal.  Abdominal:     General: There is no distension.  Skin:    General: Skin is warm and dry.  Neurological:     Mental Status: She is alert.  Psychiatric:        Mood and Affect: Mood is not anxious.     ED Results / Procedures / Treatments   Labs (all labs ordered are listed, but only abnormal results are displayed) Labs Reviewed  RESP PANEL BY RT-PCR (FLU A&B, COVID) ARPGX2 - Abnormal; Notable for the following components:      Result Value   SARS Coronavirus 2 by RT PCR POSITIVE (*)    All other components within normal limits    EKG None  Radiology No results found.  Procedures Procedures  (including critical care time)  Medications Ordered in ED Medications - No data to display  ED Course  I have reviewed the triage vital signs and the nursing notes.  Pertinent labs & imaging results that were available during my care of the patient were reviewed by me and considered in my medical decision making (see chart for details).    MDM Rules/Calculators/A&P                          COVID test is positive.  Patient is well-appearing.  Is hypertensive but otherwise vitals are unremarkable.  No increased work of breathing.  Discussed chest x-ray but at this point we will hold off given her otherwise well appearance.  She appears stable for discharge home with outpatient follow-up and return precautions.  Joan Sims was evaluated in Emergency Department on 09/04/2020 for the symptoms described in the history of present illness. She was evaluated in the context of the global COVID-19 pandemic, which necessitated consideration that the patient might be at risk for infection with the SARS-CoV-2 virus that causes COVID-19. Institutional protocols and algorithms that pertain to the evaluation of patients at risk for COVID-19 are in a state of rapid change based on information released by regulatory bodies including the CDC and federal and state organizations. These policies and algorithms were followed during the patient's care in the ED.  Final Clinical Impression(s) / ED Diagnoses Final diagnoses:  COVID-19 virus infection    Rx / DC Orders ED Discharge Orders    None       Pricilla Loveless, MD 09/04/20 2047

## 2020-09-06 ENCOUNTER — Telehealth: Payer: Self-pay | Admitting: Physician Assistant

## 2020-09-06 NOTE — Telephone Encounter (Signed)
Called to discuss with Joan Sims about Covid symptoms and the use of  monoclonal antibody infusion for those with mild to moderate Covid symptoms and at a high risk of hospitalization.     Pt is qualified for this infusion due to co-morbid conditions and/or a member of an at-risk group, however declines infusion at this time. She will check with insurance company and call us back.   Symptoms reviewed as well as criteria for ending isolation.  Symptoms reviewed that would warrant ED/Hospital evaluation. Preventative practices reviewed. Patient verbalized understanding. Patient advised to call back if he decides that he does want to get infusion. Callback number to the infusion center given. Patient advised to go to Urgent care or ED with severe symptoms. Last date shewould be eligible for infusion is 09/09/20.  Risk factors:BMI of 35. Hx of gestational diabetics and HTN.    Manson Passey, [place]

## 2020-09-15 ENCOUNTER — Telehealth: Payer: Self-pay | Admitting: General Practice

## 2020-09-15 NOTE — Telephone Encounter (Signed)
[facility] [phone] called pt [phone] LVM to schedule HFU appt
